# Patient Record
Sex: Female | Born: 1975 | Race: White | Hispanic: No | Smoking: Never smoker
Health system: Southern US, Community
[De-identification: ages and names within clinical notes are randomized; demographics above are authoritative.]

## PROBLEM LIST (undated history)

## (undated) DIAGNOSIS — Z8489 Family history of other specified conditions: Secondary | ICD-10-CM

## (undated) DIAGNOSIS — K219 Gastro-esophageal reflux disease without esophagitis: Secondary | ICD-10-CM

## (undated) DIAGNOSIS — I10 Essential (primary) hypertension: Secondary | ICD-10-CM

## (undated) DIAGNOSIS — Z803 Family history of malignant neoplasm of breast: Secondary | ICD-10-CM

## (undated) DIAGNOSIS — Z9189 Other specified personal risk factors, not elsewhere classified: Secondary | ICD-10-CM

## (undated) DIAGNOSIS — M1990 Unspecified osteoarthritis, unspecified site: Secondary | ICD-10-CM

## (undated) DIAGNOSIS — Z1371 Encounter for nonprocreative screening for genetic disease carrier status: Secondary | ICD-10-CM

## (undated) DIAGNOSIS — J45909 Unspecified asthma, uncomplicated: Secondary | ICD-10-CM

## (undated) HISTORY — PX: BILATERAL CARPAL TUNNEL RELEASE: SHX6508

## (undated) HISTORY — PX: CHOLECYSTECTOMY: SHX55

## (undated) HISTORY — PX: WISDOM TOOTH EXTRACTION: SHX21

## (undated) HISTORY — DX: Encounter for nonprocreative screening for genetic disease carrier status: Z13.71

## (undated) HISTORY — PX: DILATION AND CURETTAGE OF UTERUS: SHX78

## (undated) HISTORY — DX: Other specified personal risk factors, not elsewhere classified: Z91.89

## (undated) HISTORY — DX: Family history of malignant neoplasm of breast: Z80.3

---

## 1998-12-11 ENCOUNTER — Encounter: Payer: Self-pay | Admitting: Neurosurgery

## 1998-12-11 ENCOUNTER — Ambulatory Visit (HOSPITAL_COMMUNITY): Admission: RE | Admit: 1998-12-11 | Discharge: 1998-12-11 | Payer: Self-pay | Admitting: Neurosurgery

## 2006-09-18 ENCOUNTER — Other Ambulatory Visit: Payer: Self-pay

## 2006-09-18 ENCOUNTER — Emergency Department: Payer: Self-pay | Admitting: Emergency Medicine

## 2007-07-23 ENCOUNTER — Emergency Department: Payer: Self-pay | Admitting: Emergency Medicine

## 2007-08-02 ENCOUNTER — Ambulatory Visit: Payer: Self-pay | Admitting: Unknown Physician Specialty

## 2008-03-13 ENCOUNTER — Emergency Department: Payer: Self-pay | Admitting: Emergency Medicine

## 2008-07-21 ENCOUNTER — Ambulatory Visit: Payer: Self-pay

## 2008-09-01 ENCOUNTER — Observation Stay: Payer: Self-pay

## 2008-09-07 ENCOUNTER — Inpatient Hospital Stay: Payer: Self-pay | Admitting: Obstetrics & Gynecology

## 2011-09-07 ENCOUNTER — Ambulatory Visit: Payer: Self-pay | Admitting: Emergency Medicine

## 2011-09-07 DIAGNOSIS — I10 Essential (primary) hypertension: Secondary | ICD-10-CM

## 2011-09-07 LAB — HEPATIC FUNCTION PANEL A (ARMC)
Albumin: 3.6 g/dL (ref 3.4–5.0)
Alkaline Phosphatase: 59 U/L (ref 50–136)
Bilirubin, Direct: <0.1 mg/dL (ref 0.00–0.20)
Bilirubin,Total: 0.2 mg/dL (ref 0.2–1.0)
SGOT(AST): 22 U/L (ref 15–37)
SGPT (ALT): 32 U/L
Total Protein: 7.4 g/dL (ref 6.4–8.2)

## 2011-09-13 ENCOUNTER — Ambulatory Visit: Payer: Self-pay | Admitting: Emergency Medicine

## 2011-09-13 LAB — PREGNANCY, URINE: Pregnancy Test, Urine: NEGATIVE m[IU]/mL

## 2011-09-14 LAB — PATHOLOGY REPORT

## 2011-11-01 ENCOUNTER — Other Ambulatory Visit: Payer: Self-pay | Admitting: Radiology

## 2011-11-01 ENCOUNTER — Ambulatory Visit: Payer: Self-pay | Admitting: Family Medicine

## 2011-11-01 LAB — HCG, QUANTITATIVE, PREGNANCY: Beta Hcg, Quant.: <1 m[IU]/mL — ABNORMAL LOW

## 2011-11-01 LAB — CREATININE, SERUM
Creatinine: 0.76 mg/dL (ref 0.60–1.30)
EGFR (African American): >60
EGFR (Non-African Amer.): >60

## 2011-11-15 ENCOUNTER — Ambulatory Visit: Payer: Self-pay | Admitting: Family Medicine

## 2011-11-29 ENCOUNTER — Ambulatory Visit: Payer: Self-pay | Admitting: Family Medicine

## 2012-06-06 ENCOUNTER — Emergency Department: Payer: Self-pay | Admitting: Emergency Medicine

## 2012-06-06 LAB — COMPREHENSIVE METABOLIC PANEL
Albumin: 3.5 g/dL (ref 3.4–5.0)
Alkaline Phosphatase: 68 U/L (ref 50–136)
Anion Gap: 5 — ABNORMAL LOW (ref 7–16)
BUN: 22 mg/dL — ABNORMAL HIGH (ref 7–18)
Bilirubin,Total: 0.2 mg/dL (ref 0.2–1.0)
Calcium, Total: 8.4 mg/dL — ABNORMAL LOW (ref 8.5–10.1)
Chloride: 103 mmol/L (ref 98–107)
Co2: 29 mmol/L (ref 21–32)
Creatinine: 1.03 mg/dL (ref 0.60–1.30)
EGFR (African American): >60
EGFR (Non-African Amer.): >60
Glucose: 102 mg/dL — ABNORMAL HIGH (ref 65–99)
Osmolality: 277 (ref 275–301)
Potassium: 3.4 mmol/L — ABNORMAL LOW (ref 3.5–5.1)
SGOT(AST): 34 U/L (ref 15–37)
SGPT (ALT): 51 U/L (ref 12–78)
Sodium: 137 mmol/L (ref 136–145)
Total Protein: 7.5 g/dL (ref 6.4–8.2)

## 2012-06-06 LAB — CBC
HCT: 35.6 % (ref 35.0–47.0)
HGB: 12.2 g/dL (ref 12.0–16.0)
MCH: 28.2 pg (ref 26.0–34.0)
MCHC: 34.3 g/dL (ref 32.0–36.0)
MCV: 82 fL (ref 80–100)
Platelet: 263 10*3/uL (ref 150–440)
RBC: 4.34 10*6/uL (ref 3.80–5.20)
RDW: 13.2 % (ref 11.5–14.5)
WBC: 6.3 10*3/uL (ref 3.6–11.0)

## 2012-06-06 LAB — URINALYSIS, COMPLETE
Bacteria: NONE SEEN
Bilirubin,UR: NEGATIVE
Blood: NEGATIVE
Glucose,UR: NEGATIVE mg/dL (ref 0–75)
Ketone: NEGATIVE
Nitrite: NEGATIVE
Ph: 5 (ref 4.5–8.0)
Protein: NEGATIVE
RBC,UR: 6 /HPF (ref 0–5)
Specific Gravity: 1.028 (ref 1.003–1.030)
Squamous Epithelial: 2
WBC UR: 30 /HPF (ref 0–5)

## 2012-06-07 LAB — PREGNANCY, URINE: Pregnancy Test, Urine: NEGATIVE m[IU]/mL

## 2012-06-14 ENCOUNTER — Ambulatory Visit: Payer: Self-pay | Admitting: Family Medicine

## 2013-03-24 ENCOUNTER — Emergency Department: Payer: Self-pay | Admitting: General Practice

## 2013-04-13 ENCOUNTER — Ambulatory Visit: Payer: Self-pay | Admitting: Orthopedic Surgery

## 2013-10-15 ENCOUNTER — Ambulatory Visit: Payer: Self-pay | Admitting: Family Medicine

## 2013-12-04 ENCOUNTER — Encounter: Payer: Self-pay | Admitting: Orthopedic Surgery

## 2013-12-14 ENCOUNTER — Encounter: Payer: Self-pay | Admitting: Orthopedic Surgery

## 2014-01-15 ENCOUNTER — Ambulatory Visit: Payer: Self-pay | Admitting: Anesthesiology

## 2014-01-15 LAB — POTASSIUM: POTASSIUM: 3.9 mmol/L (ref 3.5–5.1)

## 2014-01-21 ENCOUNTER — Ambulatory Visit: Payer: Self-pay | Admitting: Orthopedic Surgery

## 2014-09-06 NOTE — Op Note (Signed)
PATIENT NAME:  Meghan GriceSTINSON, Tenlee D MR#:  [id number] DATE OF BIRTH:  December 28, 1975  DATE OF PROCEDURE:  01/21/2014  PREOPERATIVE DIAGNOSIS: Bilateral carpal tunnel syndrome.   POSTOPERATIVE DIAGNOSIS: Right carpal tunnel syndrome.   PROCEDURE: Bilateral carpal tunnel releases.   ANESTHESIA: General.   SURGEON: Kennedy BuckerMichael Aviyana Sonntag, MD   DESCRIPTION OF PROCEDURE: The patient was brought to the Operating Room and after adequate anesthesia was obtained, both arms were prepped and draped in the usual sterile fashion. Appropriate patient identification and timeout procedure was completed. An Esmarch was used on the right arm with Esmarch to just above the wrist as the tourniquet and volar incision was made in line with the ring metacarpal. The superficial fascia of the hand incised and the transverse carpal ligament identified. There was thenar musculature which came across the anterior volar aspect of the carpal tunnel, consistent with aberrant muscle. This was elevated and the transverse carpal ligament had opened. A vascular hemostat was placed deep to this. Release was carried out proximally and distally until fat was noted around the nerve consistent with no compression, then going more proximally, proximal to the wrist flexion crease. There was an area of constriction of the nerve in the mid carpal tunnel, and after release this appeared to be relieved. The wound was irrigated and then closed after infiltration of 10 mL of 0.5% Sensorcaine without epinephrine. The wound was closed with simple interrupted 5-0 nylon. Xeroform, 4 x 4, Webril and Ace wrap applied and the Esmarch tourniquet removed. Total tourniquet time on that side was approximately 16 minutes.  Going to the left side, an identical procedure carried out with just upper arm tourniquet with the right arm having had an IV above the elbow, so no tourniquet on that side. The tourniquet was raised to 250 mmHg and same incision made. The subcutaneous tissue  spread. The transcarpal ligament incised and release was carried out proximally and distally in a similar fashion. There was less compression visible on the median nerve on the left after release at the level of the wrist flexion crease, there was good vascular blush to the nerve. Adequate decompression had been carried out and the wound was closed in a similar fashion using 10 mL of 0.5% Sensorcaine for  analgesia. Simple interrupted 5-0 nylon skin suture. Xeroform, 4 x 4, Webril and Ace wrap applied. The patient was sent to the recovery room in stable condition.   ESTIMATED BLOOD LOSS: Minimal.   TOURNIQUET TIME: On the left was 12 minutes at 250 mmHg.   ____________________________ Leitha SchullerMichael J. Jessicamarie Amiri, MD mjm:TT D: 01/21/2014 17:02:11 ET T: 01/21/2014 22:40:28 ET JOB#: [id number]  cc: Leitha SchullerMichael J. Starleen Trussell, MD, <Dictator> Leitha SchullerMICHAEL J Arnell Slivinski MD ELECTRONICALLY SIGNED 01/23/2014 8:17

## 2014-09-07 NOTE — Op Note (Signed)
PATIENT NAME:  Meghan Thompson, Meghan Thompson MR#:  [id number] DATE OF BIRTH:  01-04-1976  DATE OF PROCEDURE:  09/13/2011  PREOPERATIVE DIAGNOSIS: Acute cholecystitis.   POSTOPERATIVE DIAGNOSIS: Acute cholecystitis.   PROCEDURE PERFORMED: Laparoscopic cholecystectomy with cholangiogram.   SURGEON: Jovita GammaMasud Dacoda Spallone, MD  DESCRIPTION OF PROCEDURE: The patient was then brought to surgery. Under general anesthesia, the abdomen was then prepped and draped. First of all, a small incision was made above the umbilicus. After cutting skin and subcutaneous tissue, the fascia was cut until we reached the peritoneum. After entering into the abdomen, I put a trocar in and air was insufflated. After that another trocar was then put in the epigastric region, two 5 mm put in the right upper quadrant of the abdomen. The gallbladder had a lot of adhesions. Those adhesions were then released one by one to make the gallbladder clean of all of the adhesions. After that the gallbladder was then lifted up with a clamp and another clamp was applied at the Hartmann's pouch area. The patient had a lot of stones in the gallbladder. It was filled with stones so hard to put a clamp there. Finally I pushed all the stones up and did a cholangiogram. Cholangiogram showed normal cystic duct, showed the common bile duct was normal, and the dye was going in the duodenum normally. After this was done, the cholangiocatheter was then removed. Dissection was done. The cystic artery was then clipped and cut. The cystic duct was then isolated, clipped and cut, and the gallbladder was then removed from the liver bed. There was some oozing on the liver bed because of multiple vessels going from the gallbladder into the liver. All the gallbladder vessels were fulgurated. The gallbladder was removed completely. After we did this irrigation of the gallbladder was done to make sure there was no biliary leak and no bleeding. The gallbladder was then removed from the  umbilical port. I had to make the incision bigger in the umbilicus to get the gallbladder out because the patient had lots of stones. After this was done, all the trocars were removed under direct vision. The umbilical port was        then closed with interrupted 0 Vicryl sutures. Marcaine was injected and staples were applied. The patient tolerated the procedure well and was sent to the recovery room in satisfactory condition.  ____________________________ Alton RevereMasud S. Cecelia ByarsHashmi, MD msh:slb Thompson: 09/13/2011 10:53:33 ET T: 09/13/2011 11:29:00 ET JOB#: [id number]  cc: Stevie Charter S. Cecelia ByarsHashmi, MD, <Dictator> Meryle ReadyMASUD S Evin Loiseau MD ELECTRONICALLY SIGNED 09/13/2011 15:23

## 2014-11-10 ENCOUNTER — Other Ambulatory Visit: Payer: Self-pay | Admitting: Family Medicine

## 2014-11-10 ENCOUNTER — Ambulatory Visit
Admission: RE | Admit: 2014-11-10 | Discharge: 2014-11-10 | Disposition: A | Discharge status: Home / Self Care | Payer: BLUE CROSS/BLUE SHIELD | Source: Ambulatory Visit | Attending: Family Medicine | Admitting: Family Medicine

## 2014-11-10 DIAGNOSIS — S86801A Unspecified injury of other muscle(s) and tendon(s) at lower leg level, right leg, initial encounter: Secondary | ICD-10-CM | POA: Insufficient documentation

## 2014-11-10 DIAGNOSIS — S8001XA Contusion of right knee, initial encounter: Secondary | ICD-10-CM | POA: Insufficient documentation

## 2014-11-10 DIAGNOSIS — M25561 Pain in right knee: Secondary | ICD-10-CM | POA: Diagnosis present

## 2014-11-10 DIAGNOSIS — M25861 Other specified joint disorders, right knee: Secondary | ICD-10-CM | POA: Diagnosis not present

## 2014-11-10 DIAGNOSIS — M948X6 Other specified disorders of cartilage, lower leg: Secondary | ICD-10-CM | POA: Insufficient documentation

## 2014-11-10 DIAGNOSIS — S8991XA Unspecified injury of right lower leg, initial encounter: Secondary | ICD-10-CM | POA: Insufficient documentation

## 2015-01-12 ENCOUNTER — Ambulatory Visit
Admission: RE | Admit: 2015-01-12 | Discharge: 2015-01-12 | Disposition: A | Discharge status: Home / Self Care | Payer: BLUE CROSS/BLUE SHIELD | Source: Ambulatory Visit | Attending: Family Medicine | Admitting: Family Medicine

## 2015-01-12 ENCOUNTER — Other Ambulatory Visit: Payer: Self-pay | Admitting: Family Medicine

## 2015-01-12 DIAGNOSIS — Z1231 Encounter for screening mammogram for malignant neoplasm of breast: Secondary | ICD-10-CM

## 2015-01-29 ENCOUNTER — Other Ambulatory Visit: Payer: BLUE CROSS/BLUE SHIELD

## 2015-01-29 ENCOUNTER — Encounter: Payer: Self-pay | Admitting: *Deleted

## 2015-01-29 NOTE — Patient Instructions (Signed)
  Your procedure is scheduled on: 02-03-15 Report to [facility] SAME [facility]GERY 2ND FLOOR To find out your arrival time please call [phone] between 1PM - 3PM on 02-02-15 [facility])  Remember: Instructions that are not followed completely may result in serious medical risk, up to and including death, or upon the discretion of your surgeon and anesthesiologist your surgery may need to be rescheduled.    _X___ 1. Do not eat food or drink liquids after midnight. No gum chewing or hard candies.     _X___ 2. No Alcohol for 24 hours before or after surgery.   ____ 3. Bring all medications with you on the day of surgery if instructed.    _X___ 4. Notify your doctor if there is any change in your medical condition     (cold, fever, infections).     Do not wear jewelry, make-up, hairpins, clips or nail polish.  Do not wear lotions, powders, or perfumes. You may wear deodorant.  Do not shave 48 hours prior to surgery. Men may shave face and neck.  Do not bring valuables to the hospital.    [facility] is not responsible for any belongings or valuables.               Contacts, dentures or bridgework may not be worn into surgery.  Leave your suitcase in the car. After surgery it may be brought to your room.  For patients admitted to the hospital, discharge time is determined by your treatment team.   Patients discharged the day of surgery will not be allowed to drive home.   Please read over the following fact sheets that you were given:    _X___ Take these medicines the morning of surgery with A SIP OF WATER:    1. OMEPRAZOLE  2. TAKE AN OMEPRAZOLE ON Monday NIGHT  3.   4.  5.  6.  ____ Fleet Enema (as directed)   _X___ Use CHG Soap as directed  _X___ Use inhalers on the day of surgery-USE VENTOLIN AT HOME AND BRING TO THE HOSPITAL  ____ Stop metformin 2 days prior to surgery    ____ Take 1/2 of usual insulin dose the night before surgery and none on the morning of surgery.    ____ Stop Coumadin/Plavix/aspirin-N/A  _X___ Stop Anti-inflammatories-STOP IBUPROFEN NOW-NO NSAIDS OR ASPIRIN PRODUCTS-TYLENOL OK   ____ Stop supplements until after surgery.    ____ Bring C-Pap to the hospital.

## 2015-02-02 ENCOUNTER — Encounter
Admission: RE | Admit: 2015-02-02 | Discharge: 2015-02-02 | Disposition: A | Discharge status: Home / Self Care | Payer: BLUE CROSS/BLUE SHIELD | Source: Ambulatory Visit | Attending: Anesthesiology | Admitting: Anesthesiology

## 2015-02-02 DIAGNOSIS — Z79899 Other long term (current) drug therapy: Secondary | ICD-10-CM | POA: Diagnosis not present

## 2015-02-02 DIAGNOSIS — E669 Obesity, unspecified: Secondary | ICD-10-CM | POA: Diagnosis not present

## 2015-02-02 DIAGNOSIS — M2241 Chondromalacia patellae, right knee: Secondary | ICD-10-CM | POA: Diagnosis not present

## 2015-02-02 DIAGNOSIS — K219 Gastro-esophageal reflux disease without esophagitis: Secondary | ICD-10-CM | POA: Diagnosis not present

## 2015-02-02 DIAGNOSIS — Z8249 Family history of ischemic heart disease and other diseases of the circulatory system: Secondary | ICD-10-CM | POA: Diagnosis not present

## 2015-02-02 DIAGNOSIS — Z803 Family history of malignant neoplasm of breast: Secondary | ICD-10-CM | POA: Diagnosis not present

## 2015-02-02 DIAGNOSIS — Z7951 Long term (current) use of inhaled steroids: Secondary | ICD-10-CM | POA: Diagnosis not present

## 2015-02-02 DIAGNOSIS — I10 Essential (primary) hypertension: Secondary | ICD-10-CM | POA: Diagnosis not present

## 2015-02-02 DIAGNOSIS — Z823 Family history of stroke: Secondary | ICD-10-CM | POA: Diagnosis not present

## 2015-02-02 DIAGNOSIS — M23251 Derangement of posterior horn of lateral meniscus due to old tear or injury, right knee: Secondary | ICD-10-CM | POA: Diagnosis not present

## 2015-02-02 DIAGNOSIS — Z881 Allergy status to other antibiotic agents status: Secondary | ICD-10-CM | POA: Diagnosis not present

## 2015-02-02 DIAGNOSIS — J45909 Unspecified asthma, uncomplicated: Secondary | ICD-10-CM | POA: Diagnosis not present

## 2015-02-02 DIAGNOSIS — Z6836 Body mass index (BMI) 36.0-36.9, adult: Secondary | ICD-10-CM | POA: Diagnosis not present

## 2015-02-02 DIAGNOSIS — M179 Osteoarthritis of knee, unspecified: Secondary | ICD-10-CM | POA: Diagnosis not present

## 2015-02-02 DIAGNOSIS — Z8042 Family history of malignant neoplasm of prostate: Secondary | ICD-10-CM | POA: Diagnosis not present

## 2015-02-02 DIAGNOSIS — S83014A Lateral dislocation of right patella, initial encounter: Secondary | ICD-10-CM | POA: Diagnosis not present

## 2015-02-02 DIAGNOSIS — Z9049 Acquired absence of other specified parts of digestive tract: Secondary | ICD-10-CM | POA: Diagnosis not present

## 2015-02-02 DIAGNOSIS — Z8262 Family history of osteoporosis: Secondary | ICD-10-CM | POA: Diagnosis not present

## 2015-02-02 DIAGNOSIS — S83006A Unspecified dislocation of unspecified patella, initial encounter: Secondary | ICD-10-CM | POA: Diagnosis present

## 2015-02-02 DIAGNOSIS — Z833 Family history of diabetes mellitus: Secondary | ICD-10-CM | POA: Diagnosis not present

## 2015-02-02 DIAGNOSIS — Z8261 Family history of arthritis: Secondary | ICD-10-CM | POA: Diagnosis not present

## 2015-02-02 LAB — POTASSIUM: Potassium: 4.3 mmol/L (ref 3.5–5.1)

## 2015-02-02 NOTE — OR Nursing (Signed)
Dr. Michelle Nasuti to proceed after reviewing EKG on 02/02/15

## 2015-02-03 ENCOUNTER — Ambulatory Visit
Admission: RE | Admit: 2015-02-03 | Discharge: 2015-02-03 | Disposition: A | Discharge status: Home / Self Care | Payer: BLUE CROSS/BLUE SHIELD | Source: Ambulatory Visit | Attending: Orthopedic Surgery | Admitting: Orthopedic Surgery

## 2015-02-03 ENCOUNTER — Ambulatory Visit: Payer: BLUE CROSS/BLUE SHIELD | Admitting: Anesthesiology

## 2015-02-03 ENCOUNTER — Ambulatory Visit: Payer: BLUE CROSS/BLUE SHIELD

## 2015-02-03 ENCOUNTER — Encounter
Admission: RE | Disposition: A | Discharge status: Home / Self Care | Payer: Self-pay | Source: Ambulatory Visit | Attending: Orthopedic Surgery

## 2015-02-03 DIAGNOSIS — Z8262 Family history of osteoporosis: Secondary | ICD-10-CM | POA: Insufficient documentation

## 2015-02-03 DIAGNOSIS — Z803 Family history of malignant neoplasm of breast: Secondary | ICD-10-CM | POA: Insufficient documentation

## 2015-02-03 DIAGNOSIS — Z6836 Body mass index (BMI) 36.0-36.9, adult: Secondary | ICD-10-CM | POA: Insufficient documentation

## 2015-02-03 DIAGNOSIS — Z881 Allergy status to other antibiotic agents status: Secondary | ICD-10-CM | POA: Insufficient documentation

## 2015-02-03 DIAGNOSIS — M2241 Chondromalacia patellae, right knee: Secondary | ICD-10-CM | POA: Insufficient documentation

## 2015-02-03 DIAGNOSIS — Z833 Family history of diabetes mellitus: Secondary | ICD-10-CM | POA: Insufficient documentation

## 2015-02-03 DIAGNOSIS — S83014A Lateral dislocation of right patella, initial encounter: Secondary | ICD-10-CM | POA: Diagnosis not present

## 2015-02-03 DIAGNOSIS — K219 Gastro-esophageal reflux disease without esophagitis: Secondary | ICD-10-CM | POA: Insufficient documentation

## 2015-02-03 DIAGNOSIS — Z79899 Other long term (current) drug therapy: Secondary | ICD-10-CM | POA: Insufficient documentation

## 2015-02-03 DIAGNOSIS — Z8249 Family history of ischemic heart disease and other diseases of the circulatory system: Secondary | ICD-10-CM | POA: Insufficient documentation

## 2015-02-03 DIAGNOSIS — Z419 Encounter for procedure for purposes other than remedying health state, unspecified: Secondary | ICD-10-CM

## 2015-02-03 DIAGNOSIS — Z8261 Family history of arthritis: Secondary | ICD-10-CM | POA: Insufficient documentation

## 2015-02-03 DIAGNOSIS — M179 Osteoarthritis of knee, unspecified: Secondary | ICD-10-CM | POA: Insufficient documentation

## 2015-02-03 DIAGNOSIS — Z8042 Family history of malignant neoplasm of prostate: Secondary | ICD-10-CM | POA: Insufficient documentation

## 2015-02-03 DIAGNOSIS — Z7951 Long term (current) use of inhaled steroids: Secondary | ICD-10-CM | POA: Insufficient documentation

## 2015-02-03 DIAGNOSIS — J45909 Unspecified asthma, uncomplicated: Secondary | ICD-10-CM | POA: Insufficient documentation

## 2015-02-03 DIAGNOSIS — Z9049 Acquired absence of other specified parts of digestive tract: Secondary | ICD-10-CM | POA: Insufficient documentation

## 2015-02-03 DIAGNOSIS — I10 Essential (primary) hypertension: Secondary | ICD-10-CM | POA: Insufficient documentation

## 2015-02-03 DIAGNOSIS — Z823 Family history of stroke: Secondary | ICD-10-CM | POA: Insufficient documentation

## 2015-02-03 DIAGNOSIS — E669 Obesity, unspecified: Secondary | ICD-10-CM | POA: Insufficient documentation

## 2015-02-03 DIAGNOSIS — M23251 Derangement of posterior horn of lateral meniscus due to old tear or injury, right knee: Secondary | ICD-10-CM | POA: Insufficient documentation

## 2015-02-03 HISTORY — DX: Family history of other specified conditions: Z84.89

## 2015-02-03 HISTORY — PX: KNEE ARTHROSCOPY WITH MEDIAL PATELLAR FEMORAL LIGAMENT RECONSTRUCTION: SHX5652

## 2015-02-03 HISTORY — DX: Unspecified asthma, uncomplicated: J45.909

## 2015-02-03 HISTORY — DX: Essential (primary) hypertension: I10

## 2015-02-03 HISTORY — DX: Gastro-esophageal reflux disease without esophagitis: K21.9

## 2015-02-03 SURGERY — REPAIR, TENDON, PATELLAR, ARTHROSCOPIC
Anesthesia: General | Site: Knee | Laterality: Right | Wound class: Clean

## 2015-02-03 MED ORDER — SODIUM CHLORIDE 0.9 % IV SOLN: INTRAVENOUS | Status: DC

## 2015-02-03 MED ORDER — DEXAMETHASONE SODIUM PHOSPHATE 4 MG/ML IJ SOLN
INTRAMUSCULAR | Status: DC | PRN
  Administered 2015-02-03: 5 mg via INTRAVENOUS

## 2015-02-03 MED ORDER — NEOMYCIN-POLYMYXIN B GU 40-200000 IR SOLN
Status: AC
  Filled 2015-02-03: qty 2

## 2015-02-03 MED ORDER — LIDOCAINE HCL (CARDIAC) 20 MG/ML IV SOLN
INTRAVENOUS | Status: DC | PRN
  Administered 2015-02-03: 50 mg via INTRAVENOUS

## 2015-02-03 MED ORDER — ONDANSETRON HCL 4 MG PO TABS: 4.0000 mg | ORAL_TABLET | Freq: Four times a day (QID) | ORAL | Status: DC | PRN

## 2015-02-03 MED ORDER — LACTATED RINGERS IV SOLN
INTRAVENOUS | Status: DC
  Administered 2015-02-03 (×2): via INTRAVENOUS

## 2015-02-03 MED ORDER — METOCLOPRAMIDE HCL 10 MG PO TABS: 5.0000 mg | ORAL_TABLET | Freq: Three times a day (TID) | ORAL | Status: DC | PRN

## 2015-02-03 MED ORDER — ONDANSETRON HCL 4 MG/2ML IJ SOLN
4.0000 mg | Freq: Once | INTRAMUSCULAR | Status: AC | PRN
  Administered 2015-02-03: 4 mg via INTRAVENOUS

## 2015-02-03 MED ORDER — FENTANYL CITRATE (PF) 100 MCG/2ML IJ SOLN
INTRAMUSCULAR | Status: AC
  Filled 2015-02-03: qty 2

## 2015-02-03 MED ORDER — OXYCODONE-ACETAMINOPHEN 5-325 MG PO TABS: 1.0000 | ORAL_TABLET | ORAL | Status: DC | PRN

## 2015-02-03 MED ORDER — BUPIVACAINE-EPINEPHRINE 0.5% -1:200000 IJ SOLN
INTRAMUSCULAR | Status: DC | PRN
Start: 2015-02-03 — End: 2015-02-03
  Administered 2015-02-03: 30 mL

## 2015-02-03 MED ORDER — METOCLOPRAMIDE HCL 5 MG/ML IJ SOLN: 5.0000 mg | Freq: Three times a day (TID) | INTRAMUSCULAR | Status: DC | PRN

## 2015-02-03 MED ORDER — BUPIVACAINE-EPINEPHRINE (PF) 0.25% -1:200000 IJ SOLN
INTRAMUSCULAR | Status: AC
  Filled 2015-02-03: qty 30

## 2015-02-03 MED ORDER — OXYCODONE-ACETAMINOPHEN 5-325 MG PO TABS
ORAL_TABLET | ORAL | Status: DC
Start: 2015-02-03 — End: 2015-02-03
  Filled 2015-02-03: qty 1

## 2015-02-03 MED ORDER — MIDAZOLAM HCL 2 MG/2ML IJ SOLN
INTRAMUSCULAR | Status: DC | PRN
  Administered 2015-02-03: 2 mg via INTRAVENOUS

## 2015-02-03 MED ORDER — NEOMYCIN-POLYMYXIN B GU 40-200000 IR SOLN
Status: DC | PRN
  Administered 2015-02-03: 2 mL

## 2015-02-03 MED ORDER — ONDANSETRON HCL 4 MG/2ML IJ SOLN: 4.0000 mg | Freq: Four times a day (QID) | INTRAMUSCULAR | Status: DC | PRN

## 2015-02-03 MED ORDER — CEFAZOLIN SODIUM-DEXTROSE 2-3 GM-% IV SOLR
INTRAVENOUS | Status: AC
  Filled 2015-02-03: qty 50

## 2015-02-03 MED ORDER — GLYCOPYRROLATE 0.2 MG/ML IJ SOLN
INTRAMUSCULAR | Status: DC | PRN
  Administered 2015-02-03: 0.2 mg via INTRAVENOUS

## 2015-02-03 MED ORDER — OXYCODONE-ACETAMINOPHEN 5-325 MG PO TABS
1.0000 | ORAL_TABLET | ORAL | Status: DC | PRN
  Administered 2015-02-03: 1 via ORAL

## 2015-02-03 MED ORDER — FENTANYL CITRATE (PF) 100 MCG/2ML IJ SOLN
25.0000 ug | INTRAMUSCULAR | Status: AC | PRN
  Administered 2015-02-03 (×6): 25 ug via INTRAVENOUS

## 2015-02-03 MED ORDER — OXYCODONE-ACETAMINOPHEN 5-325 MG PO TABS: 1.0000 | ORAL_TABLET | Freq: Four times a day (QID) | ORAL | Status: AC | PRN

## 2015-02-03 MED ORDER — ONDANSETRON HCL 4 MG/2ML IJ SOLN
INTRAMUSCULAR | Status: DC | PRN
  Administered 2015-02-03: 4 mg via INTRAVENOUS

## 2015-02-03 MED ORDER — FENTANYL CITRATE (PF) 100 MCG/2ML IJ SOLN
INTRAMUSCULAR | Status: DC | PRN
  Administered 2015-02-03 (×5): 50 ug via INTRAVENOUS

## 2015-02-03 MED ORDER — ONDANSETRON HCL 4 MG/2ML IJ SOLN
INTRAMUSCULAR | Status: AC
  Filled 2015-02-03: qty 2

## 2015-02-03 MED ORDER — CEFAZOLIN SODIUM-DEXTROSE 2-3 GM-% IV SOLR
2.0000 g | Freq: Once | INTRAVENOUS | Status: AC
  Administered 2015-02-03: 2 g via INTRAVENOUS

## 2015-02-03 MED ORDER — PROPOFOL 10 MG/ML IV BOLUS
INTRAVENOUS | Status: DC | PRN
  Administered 2015-02-03: 150 mg via INTRAVENOUS

## 2015-02-03 MED ORDER — BUPIVACAINE-EPINEPHRINE (PF) 0.5% -1:200000 IJ SOLN
INTRAMUSCULAR | Status: AC
  Filled 2015-02-03: qty 30

## 2015-02-03 SURGICAL SUPPLY — 34 items
BANDAGE ELASTIC 4 CLIP ST LF (GAUZE/BANDAGES/DRESSINGS) ×3 IMPLANT
BLADE INCISOR PLUS 4.5 (BLADE) ×3 IMPLANT
BLADE SHAVER 4.5 DBL SERAT CV (CUTTER) ×3 IMPLANT
BRACE KNEE POST OP SHORT (BRACE) ×2 IMPLANT
CHLORAPREP W/TINT 26ML (MISCELLANEOUS) ×3 IMPLANT
DRAPE C-ARM XRAY 36X54 (DRAPES) ×3 IMPLANT
DRAPE C-ARMOR (DRAPES) ×3 IMPLANT
ELECT CAUTERY BLADE 6.4 (BLADE) ×3 IMPLANT
GAUZE PETRO XEROFOAM 1X8 (MISCELLANEOUS) ×3 IMPLANT
GAUZE SPONGE 4X4 12PLY STRL (GAUZE/BANDAGES/DRESSINGS) ×3 IMPLANT
GLOVE SURG ORTHO 9.0 STRL STRW (GLOVE) ×3 IMPLANT
GOWN SPECIALTY ULTRA XL (MISCELLANEOUS) ×3 IMPLANT
GRADUATE 1200CC STRL 31836 (MISCELLANEOUS) ×3 IMPLANT
GRAFT TISS 230-320 GRACILIS (Bone Implant) IMPLANT
IV LACTATED RINGER IRRG 3000ML (IV SOLUTION) ×12
IV LR IRRIG 3000ML ARTHROMATIC (IV SOLUTION) ×4 IMPLANT
MANIFOLD NEPTUNE II (INSTRUMENTS) ×3 IMPLANT
NDL FILTER BLUNT 18X1 1/2 (NEEDLE) ×1 IMPLANT
NEEDLE FILTER BLUNT 18X 1/2SAF (NEEDLE) ×2
NEEDLE FILTER BLUNT 18X1 1/2 (NEEDLE) ×1 IMPLANT
PACK ARTHROSCOPY KNEE (MISCELLANEOUS) ×3 IMPLANT
PACK IMPLANT BIOCOMPOSITE MPFL (Orthopedic Implant) ×2 IMPLANT
PENCIL ELECTRO HAND CTR (MISCELLANEOUS) ×3 IMPLANT
SET TUBE SUCT SHAVER OUTFL 24K (TUBING) ×3 IMPLANT
SET TUBE TIP INTRA-ARTICULAR (MISCELLANEOUS) ×3 IMPLANT
SUCTION FRAZIER TIP 10 FR DISP (SUCTIONS) ×3 IMPLANT
SUT ETHILON 4-0 (SUTURE) ×3
SUT ETHILON 4-0 FS2 18XMFL BLK (SUTURE) ×1
SUTURE ETHLN 4-0 FS2 18XMF BLK (SUTURE) ×1 IMPLANT
SYR 5ML LL (SYRINGE) ×3 IMPLANT
TENDON GRACILIS FROZEN (Bone Implant) ×3 IMPLANT
TENDON GRACILIS FROZEN 230-320 (Bone Implant) ×1 IMPLANT
TUBING ARTHRO INFLOW-ONLY STRL (TUBING) ×3 IMPLANT
WAND HAND CNTRL MULTIVAC 50 (MISCELLANEOUS) ×3 IMPLANT

## 2015-02-03 NOTE — Anesthesia Preprocedure Evaluation (Signed)
Anesthesia Evaluation  Patient identified by MRN, date of birth, ID band Patient awake    Airway Mallampati: III       Dental no notable dental hx.    Pulmonary asthma ,     + decreased breath sounds      Cardiovascular hypertension, Pt. on medications Normal cardiovascular exam     Neuro/Psych    GI/Hepatic Neg liver ROS, GERD  Medicated,  Endo/Other  negative endocrine ROS  Renal/GU negative Renal ROS     Musculoskeletal   Abdominal (+) + obese,   Peds  Hematology negative hematology ROS (+)   Anesthesia Other Findings   Reproductive/Obstetrics                             Anesthesia Physical Anesthesia Plan  ASA: III  Anesthesia Plan: General   Post-op Pain Management:    Induction: Intravenous  Airway Management Planned: LMA  Additional Equipment:   Intra-op Plan:   Post-operative Plan:   Informed Consent: I have reviewed the patients History and Physical, chart, labs and discussed the procedure including the risks, benefits and alternatives for the proposed anesthesia with the patient or authorized representative who has indicated his/her understanding and acceptance.     Plan Discussed with: CRNA  Anesthesia Plan Comments:         Anesthesia Quick Evaluation

## 2015-02-03 NOTE — OR Nursing (Signed)
Nauseated/ Zofran  IVP given.  Cool cloth to forehead.  Safety assessment and consent for aromatherapy received.  Peppermint oil on cotton ball for inhalation in room to aid with nausea.

## 2015-02-03 NOTE — Anesthesia Postprocedure Evaluation (Signed)
  Anesthesia Post-op Note  Patient: [name]  Procedure(s) Performed: Procedure(s): KNEE ARTHROSCOPY WITH PARTIAL LASTERAL MENISECTOMY, MICROSCOPIC LATERAL RELEASE, MPFL RECONSTRUCTION (Right)  Anesthesia type:General  Patient location: PACU  Post pain: Pain level controlled  Post assessment: Post-op Vital signs reviewed, Patient's Cardiovascular Status Stable, Respiratory Function Stable, Patent Airway and No signs of Nausea or vomiting  Post vital signs: Reviewed and stable  Last Vitals:  Filed Vitals:   02/03/15 1235  BP:   Pulse: 74  Temp:   Resp: 16    Level of consciousness: awake, alert  and patient cooperative  Complications: No apparent anesthesia complications

## 2015-02-03 NOTE — Op Note (Signed)
02/03/2015  12:16 PM  PATIENT:  Meghan Thompson  39 y.o. female  PRE-OPERATIVE DIAGNOSIS:  lateral dislocation of patella  POST-OPERATIVE DIAGNOSIS:  lateral dislocation of patella, lateral meniscus tear, arthritis  PROCEDURE:  Procedure(s): KNEE ARTHROSCOPY WITH PARTIAL LASTERAL MENISECTOMY, MICROSCOPIC LATERAL RELEASE, MPFL RECONSTRUCTION (Right)  SURGEON: Leitha Schuller, MD  ASSISTANTS: None  ANESTHESIA:   general  EBL:  Total I/O In: 800 [I.V.:800] Out: -   BLOOD ADMINISTERED:none  DRAINS: none   LOCAL MEDICATIONS USED:  MARCAINE     SPECIMEN:  No Specimen  DISPOSITION OF SPECIMEN:  N/A  COUNTS:  YES  TOURNIQUET:  83 minutes at 300 mmHg  IMPLANTS: Arthrex interference screw and suture anchors  DICTATION: .Dragon Dictation patient brought the operating room and after adequate general anesthesia was obtained the right leg was prepped and draped in sterile fashion. After patient identification and timeout procedures were completed, tourniquet was raised to 300 mmHg. Inferolateral portal was made and the arthroscope was introduced initial inspection revealing nearly dislocated patella. 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 Going to the anterior cruciate ligament PCL was intact. Lateral compartment revealed posterior horn tear of the lateral meniscus involving the middle and posterior thirds approximately the inner third with a flap tear present. This is debrided with use of a meniscal punch and ArthriCare wand. There is also significant grade 3 changes to the tibial condyle with fissuring down to the bone. At this point a lateral release was carried out after having address the meniscus with the patella not being able to be reduced to the midline which she could not be done  previously. Next the arthroscope and argentation withdrawn and the open NPFL reconstruction carried out first preparing the graft reversals tendon sutures of both hands. An incision was made medial to patella the patella and with C-arm to guide were inserted in the proximal half of the patella drilling and then setting the suture anchor with tendon into these tunnels care was taken then to determine the attachment point for the graft onto the femoral side using the Arthrex guide a small incision was made drilling guide were inserted in the appropriate position and drilled out medial and's and slightly anterior and slightly proximal this was overdrilled fracture 35 mm and the graft was then passed through the tunnel into the tunnel rather and held tight as the patella was held in a man in the midline position the interference screw was then passed over the nitinol wire and there is a good compressive fit with the graft appearing to have appropriate tension fluoroscopic views were obtained of the mid patella appeared to be in the midline and these were saved. All the introducer removed at this time and the arthroscope was reintroduced patella was now in the midline with appropriate location. The knee was irrigated until clear argentation again withdrawn and the wounds are closed with 2-0 Vicryl substantially and skin staples 30 cc of half percent Sensorcaine with epinephrine was infiltrated in the para-articular tissue the wound was dressed with Xeroform 4 x 4's web roll ABDs and Ace wrap followed by a hinged knee brace locked from 20-60 patient sent to recovery in stable condition  PLAN OF CARE: Discharge to home after PACU  PATIENT DISPOSITION:  PACU - hemodynamically stable.

## 2015-02-03 NOTE — Anesthesia Procedure Notes (Signed)
Procedure Name: LMA Insertion Performed by: LOGAN, BENJAMIN Pre-anesthesia Checklist: Patient identified, Patient being monitored, Timeout performed, Emergency Drugs available and Suction available Patient Re-evaluated:Patient Re-evaluated prior to inductionOxygen Delivery Method: Circle system utilized Preoxygenation: Pre-oxygenation with 100% oxygen Intubation Type: IV induction LMA: LMA inserted LMA Size: 3.0 Tube type: Oral Number of attempts: 1 Placement Confirmation: positive ETCO2 and breath sounds checked- equal and bilateral Tube secured with: Tape Dental Injury: Teeth and Oropharynx as per pre-operative assessment        

## 2015-02-03 NOTE — OR Nursing (Signed)
Meghan Thompson. No  Further vomiting

## 2015-02-03 NOTE — OR Nursing (Signed)
Patient refused pregnancy test this am stating "I am in a same sex relationship and I just finished my period."  Dr. Mordecai Rasmussen on unit and informed order received not to obtain urine pregnancy test this am.

## 2015-02-03 NOTE — H&P (Signed)
Reviewed paper H+P, will be scanned into chart. No changes noted.  

## 2015-02-03 NOTE — Discharge Instructions (Addendum)
Keep brace on until recheck. Partial weightbearing to the right leg. Aspirin 325 mg daily until walking normally  AMBULATORY SURGERY  DISCHARGE INSTRUCTIONS   1) The drugs that you were given will stay in your system until tomorrow so for the next 24 hours you should not:  A) Drive an automobile B) Make any legal decisions C) Drink any alcoholic beverage   2) You may resume regular meals tomorrow.  Today it is better to start with liquids and gradually work up to solid foods.  You may eat anything you prefer, but it is better to start with liquids, then soup and crackers, and gradually work up to solid foods.   3) Please notify your doctor immediately if you have any unusual bleeding, trouble breathing, redness and pain at the surgery site, drainage, fever, or pain not relieved by medication.    4) Additional Instructions: Sponge bath until follow up appointment.  Please contact your physician with any problems or Same Day Surgery at [phone], Monday through Friday 6 am to 4 pm, or [place] at [facility] number at [phone].

## 2015-02-03 NOTE — OR Nursing (Signed)
Vomitted 200cc of clear yelllow liquid

## 2015-02-03 NOTE — Transfer of Care (Signed)
Immediate Anesthesia Transfer of Care Note  Patient: Meghan Thompson  Procedure(s) Performed: Procedure(s): KNEE ARTHROSCOPY WITH PARTIAL LASTERAL MENISECTOMY, MICROSCOPIC LATERAL RELEASE, MPFL RECONSTRUCTION (Right)  Patient Location: PACU  Anesthesia Type:General  Level of Consciousness: awake  Airway & Oxygen Therapy: Patient Spontanous Breathing and Patient connected to face mask oxygen  Post-op Assessment: Report given to RN  Post vital signs: Reviewed  Last Vitals:  Filed Vitals:   02/03/15 1217  BP: 155/81  Pulse: 82  Temp: 38.1 C  Resp: 14    Complications: No apparent anesthesia complications

## 2015-05-17 DIAGNOSIS — Z9189 Other specified personal risk factors, not elsewhere classified: Secondary | ICD-10-CM

## 2015-05-17 DIAGNOSIS — Z1371 Encounter for nonprocreative screening for genetic disease carrier status: Secondary | ICD-10-CM

## 2015-05-17 DIAGNOSIS — Z803 Family history of malignant neoplasm of breast: Secondary | ICD-10-CM

## 2015-05-17 HISTORY — DX: Family history of malignant neoplasm of breast: Z80.3

## 2015-05-17 HISTORY — DX: Encounter for nonprocreative screening for genetic disease carrier status: Z13.71

## 2015-05-17 HISTORY — DX: Other specified personal risk factors, not elsewhere classified: Z91.89

## 2015-10-16 ENCOUNTER — Encounter: Payer: Self-pay | Admitting: Orthopedic Surgery

## 2015-10-23 ENCOUNTER — Other Ambulatory Visit: Payer: Self-pay | Admitting: Family Medicine

## 2015-10-23 DIAGNOSIS — Z1231 Encounter for screening mammogram for malignant neoplasm of breast: Secondary | ICD-10-CM

## 2016-01-13 ENCOUNTER — Ambulatory Visit
Admission: RE | Admit: 2016-01-13 | Discharge: 2016-01-13 | Disposition: A | Discharge status: Home / Self Care | Payer: BLUE CROSS/BLUE SHIELD | Source: Ambulatory Visit | Attending: Family Medicine | Admitting: Family Medicine

## 2016-01-13 ENCOUNTER — Other Ambulatory Visit: Payer: Self-pay | Admitting: Family Medicine

## 2016-01-13 DIAGNOSIS — Z1231 Encounter for screening mammogram for malignant neoplasm of breast: Secondary | ICD-10-CM

## 2016-08-30 IMAGING — CR DG C-ARM 61-120 MIN
2 series · 2 of 2 positions shown · non-contrast
Comparison: MRI the right knee 11/10/2014

CLINICAL DATA: Perioperative.

EXAM:
RIGHT KNEE - 1-2 VIEW; DG C-ARM 61-120 MIN

[cont. (1 of 2)]
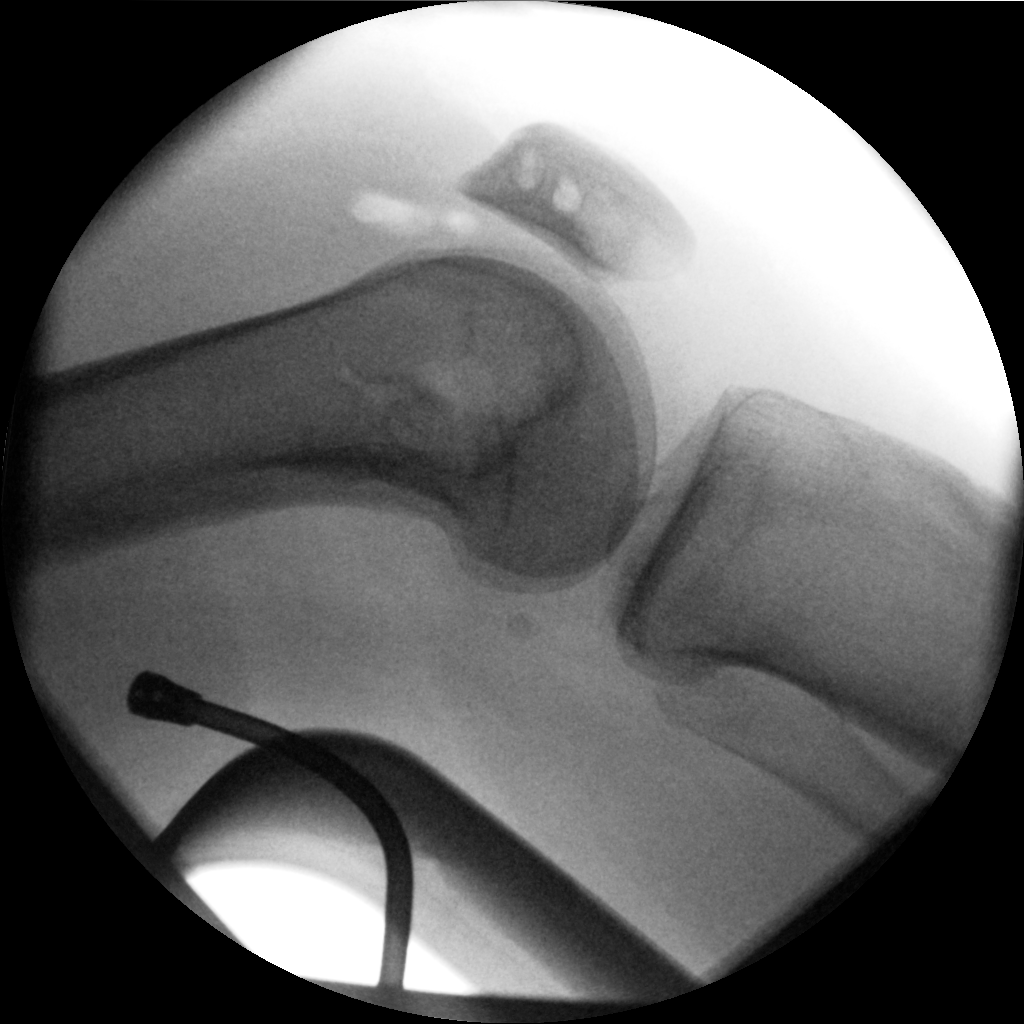

[cont. (2 of 2)]
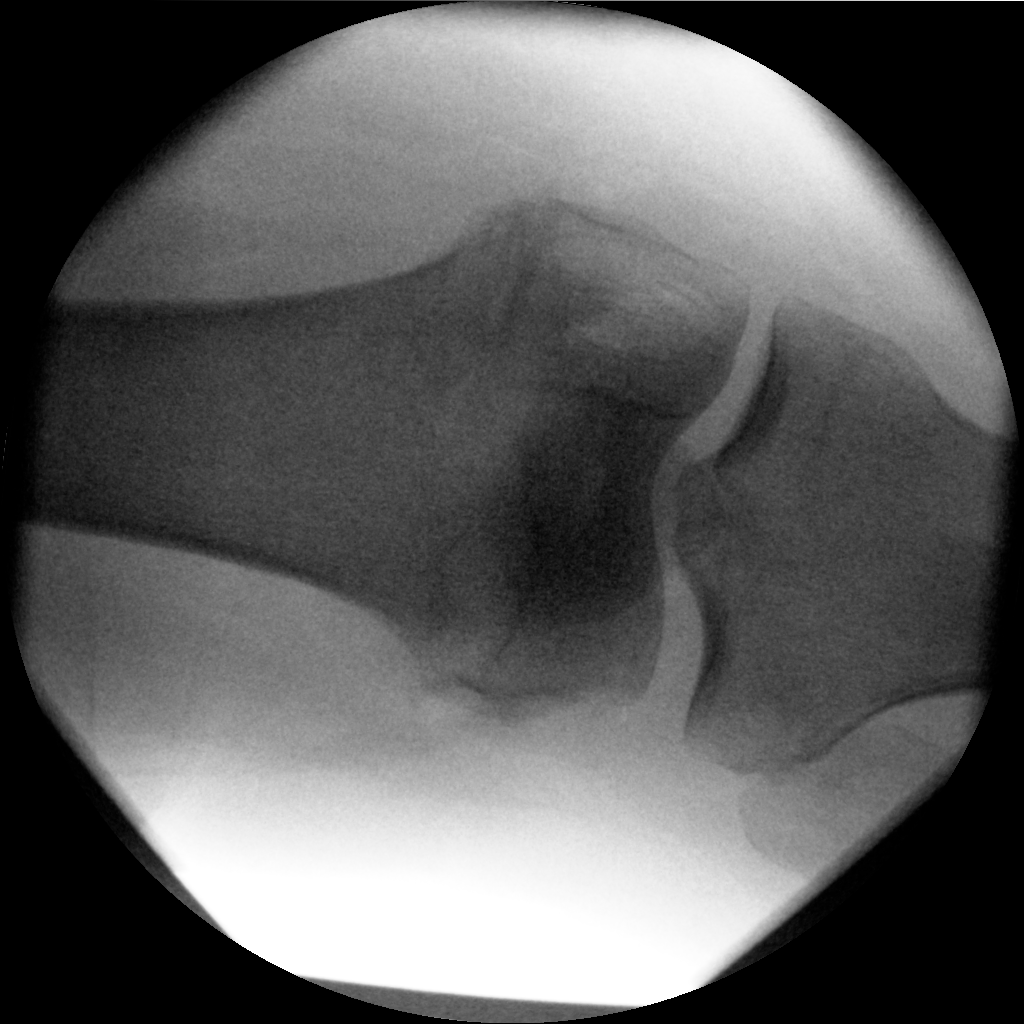

[2 of 2 positions shown; findings below may reference images not displayed]

FINDINGS: AP spot fluoroscopic view of the right knee. Joint spaces are
maintained. No acute bony abnormality visualized on the single
fluoroscopic view.
IMPRESSION: Spot fluoroscopic image of the right knee for operative purposes.

## 2016-12-22 ENCOUNTER — Other Ambulatory Visit: Payer: Self-pay | Admitting: Family Medicine

## 2016-12-22 DIAGNOSIS — Z1231 Encounter for screening mammogram for malignant neoplasm of breast: Secondary | ICD-10-CM

## 2017-01-17 ENCOUNTER — Ambulatory Visit
Admission: RE | Admit: 2017-01-17 | Discharge: 2017-01-17 | Disposition: A | Discharge status: Home / Self Care | Payer: BLUE CROSS/BLUE SHIELD | Source: Ambulatory Visit | Attending: Family Medicine | Admitting: Family Medicine

## 2017-01-17 DIAGNOSIS — Z1231 Encounter for screening mammogram for malignant neoplasm of breast: Secondary | ICD-10-CM | POA: Diagnosis not present

## 2017-07-03 ENCOUNTER — Ambulatory Visit: Payer: Self-pay | Admitting: Obstetrics and Gynecology

## 2017-07-04 ENCOUNTER — Ambulatory Visit: Payer: Self-pay | Admitting: Obstetrics and Gynecology

## 2017-07-25 ENCOUNTER — Encounter: Payer: Self-pay | Admitting: Obstetrics and Gynecology

## 2017-07-25 ENCOUNTER — Ambulatory Visit (INDEPENDENT_AMBULATORY_CARE_PROVIDER_SITE_OTHER): Payer: BLUE CROSS/BLUE SHIELD | Admitting: Obstetrics and Gynecology

## 2017-07-25 VITALS — BP 128/88 | HR 69 | Ht 60.0 in | Wt 182.0 lb

## 2017-07-25 DIAGNOSIS — Z01419 Encounter for gynecological examination (general) (routine) without abnormal findings: Secondary | ICD-10-CM

## 2017-07-25 DIAGNOSIS — Z1231 Encounter for screening mammogram for malignant neoplasm of breast: Secondary | ICD-10-CM | POA: Diagnosis not present

## 2017-07-25 DIAGNOSIS — Z803 Family history of malignant neoplasm of breast: Secondary | ICD-10-CM

## 2017-07-25 DIAGNOSIS — Z9189 Other specified personal risk factors, not elsewhere classified: Secondary | ICD-10-CM

## 2017-07-25 DIAGNOSIS — Z1239 Encounter for other screening for malignant neoplasm of breast: Secondary | ICD-10-CM

## 2017-07-25 NOTE — Progress Notes (Signed)
PCP:  Donnie Coffin, MD   Chief Complaint  Patient presents with  . Gynecologic Exam     HPI:      Ms. Meghan Thompson is a 42 y.o. G2P1011 who LMP was Patient's last menstrual period was 07/03/2017., presents today for her annual examination.  Her menses are regular every 28-30 days, lasting 5 days.  Dysmenorrhea none. She does not have intermenstrual bleeding.  Sex activity: has sex with females.  Last Pap: June 23, 2015  Results were: no abnormalities /neg HPV DNA  Hx of STDs: none  Last mammogram: January 17, 2017  Results were: normal--routine follow-up in 12 months There is a FH of breast cancer in her mother. There is no FH of ovarian cancer. Pt is MyRisk neg 2017, IBIS=22.3%. The patient does not do self-breast exams. She is taking Vit D supp. Has not had scr breast MRI (too late now for this yr).  Tobacco use: The patient denies current or previous tobacco use. Alcohol use: none No drug use.  Exercise: moderately active  She does get adequate calcium and Vitamin D in her diet.  Labs with PCP.    Past Medical History:  Diagnosis Date  . Asthma   . BRCA negative 2017   MyRisk neg  . Family history of adverse reaction to anesthesia    MOM HAS A HARD TIME WAKING UP  . Family history of breast cancer 2017  . GERD (gastroesophageal reflux disease)   . Hypertension   . Increased risk of breast cancer 2017   IBIS=22.3%    Past Surgical History:  Procedure Laterality Date  . BILATERAL CARPAL TUNNEL RELEASE    . CESAREAN SECTION    . CHOLECYSTECTOMY    . DILATION AND CURETTAGE OF UTERUS    . KNEE ARTHROSCOPY WITH MEDIAL PATELLAR FEMORAL LIGAMENT RECONSTRUCTION Right 02/03/2015   Procedure: KNEE ARTHROSCOPY WITH PARTIAL LASTERAL MENISECTOMY, MICROSCOPIC LATERAL RELEASE, MPFL RECONSTRUCTION;  Surgeon: Hessie Knows, MD;  Location: [facility] ORS;  Service: Orthopedics;  Laterality: Right;  . WISDOM TOOTH EXTRACTION      Family History  Problem Relation Age of  Onset  . Breast cancer Mother 54  . Diabetes Mother   . Hypertension Mother   . Hypertension Father   . Prostate cancer Father   . Brain cancer Maternal Uncle   . Brain cancer Maternal Uncle   . Lung cancer Maternal Uncle     Social History   Socioeconomic History  . Marital status: Significant Other    Spouse name: Not on file  . Number of children: Not on file  . Years of education: Not on file  . Highest education level: Not on file  Social Needs  . Financial resource strain: Not on file  . Food insecurity - worry: Not on file  . Food insecurity - inability: Not on file  . Transportation needs - medical: Not on file  . Transportation needs - non-medical: Not on file  Occupational History  . Not on file  Tobacco Use  . Smoking status: Never Smoker  . Smokeless tobacco: Never Used  Substance and Sexual Activity  . Alcohol use: No  . Drug use: No  . Sexual activity: Yes  Other Topics Concern  . Not on file  Social History Narrative  . Not on file    Current Meds  Medication Sig  . Albuterol (VENTOLIN IN) Inhale 2 puffs into the lungs as needed.  . fluticasone (FLONASE) 50 MCG/ACT nasal spray Place  1 spray into both nostrils as needed for allergies or rhinitis.  Marland Kitchen ibuprofen (ADVIL,MOTRIN) 200 MG tablet Take 200 mg by mouth every 6 (six) hours as needed.  Marland Kitchen lisinopril-hydrochlorothiazide (PRINZIDE,ZESTORETIC) 20-12.5 MG per tablet Take 1 tablet by mouth at bedtime.  . methocarbamol (ROBAXIN) 500 MG tablet Take 500 mg by mouth as needed for muscle spasms.  Marland Kitchen omeprazole (PRILOSEC) 10 MG capsule Take 10 mg by mouth as needed.     ROS:  Review of Systems  Constitutional: Negative for fatigue, fever and unexpected weight change.  Respiratory: Negative for cough, shortness of breath and wheezing.   Cardiovascular: Negative for chest pain, palpitations and leg swelling.  Gastrointestinal: Negative for blood in stool, constipation, diarrhea, nausea and vomiting.    Endocrine: Negative for cold intolerance, heat intolerance and polyuria.  Genitourinary: Negative for dyspareunia, dysuria, flank pain, frequency, genital sores, hematuria, menstrual problem, pelvic pain, urgency, vaginal bleeding, vaginal discharge and vaginal pain.  Musculoskeletal: Negative for back pain, joint swelling and myalgias.  Skin: Negative for rash.  Neurological: Negative for dizziness, syncope, light-headedness, numbness and headaches.  Hematological: Negative for adenopathy.  Psychiatric/Behavioral: Negative for agitation, confusion, sleep disturbance and suicidal ideas. The patient is not nervous/anxious.     Objective: BP 128/88   Pulse 69   Ht 5' (1.524 m)   Wt 182 lb (82.6 kg)   LMP 07/03/2017   BMI 35.54 kg/m    Physical Exam  Constitutional: She is oriented to person, place, and time. She appears well-developed and well-nourished.  Genitourinary: Vagina normal and uterus normal. There is no rash or tenderness on the right labia. There is no rash or tenderness on the left labia. No erythema or tenderness in the vagina. No vaginal discharge found. Right adnexum does not display mass and does not display tenderness. Left adnexum does not display mass and does not display tenderness. Cervix does not exhibit motion tenderness or polyp. Uterus is not enlarged or tender.  Neck: Normal range of motion. No thyromegaly present.  Cardiovascular: Normal rate, regular rhythm and normal heart sounds.  No murmur heard. Pulmonary/Chest: Effort normal and breath sounds normal. Right breast exhibits no mass, no nipple discharge, no skin change and no tenderness. Left breast exhibits no mass, no nipple discharge, no skin change and no tenderness.  Abdominal: Soft. There is no tenderness. There is no guarding.  Musculoskeletal: Normal range of motion.  Neurological: She is alert and oriented to person, place, and time. No cranial nerve deficit.  Psychiatric: She has a normal mood and  affect. Her behavior is normal.  Vitals reviewed.   Assessment/Plan: Encounter for annual routine gynecological examination  Screening for breast cancer - Pt to sched mammo. - Plan: MM SCREENING BREAST TOMO BILATERAL  Family history of breast cancer - Plan: MM SCREENING BREAST TOMO BILATERAL  Increased risk of breast cancer - IBIS=22%. Cont monthly SBE, yearly CBE and mammos. Offered scr breast MRI, f/u prn ref. Cont Vit D.      GYN counsel breast self exam, mammography screening, adequate intake of calcium and vitamin D, diet and exercise     F/U  Return in about 1 year (around 07/26/2018).  Alicia B. Copland, PA-C 07/25/2017 8:39 AM

## 2017-07-25 NOTE — Patient Instructions (Addendum)
I value your feedback and entrusting us with your care. If you get a [facility] patient survey, I would appreciate you taking the time to let us know about your experience today. Thank you!  [facility]: [phone]    

## 2018-02-06 ENCOUNTER — Encounter: Payer: Self-pay | Admitting: Obstetrics and Gynecology

## 2018-02-06 ENCOUNTER — Ambulatory Visit
Admission: RE | Admit: 2018-02-06 | Discharge: 2018-02-06 | Disposition: A | Discharge status: Home / Self Care | Payer: BLUE CROSS/BLUE SHIELD | Source: Ambulatory Visit | Attending: Obstetrics and Gynecology | Admitting: Obstetrics and Gynecology

## 2018-02-06 DIAGNOSIS — Z803 Family history of malignant neoplasm of breast: Secondary | ICD-10-CM | POA: Diagnosis present

## 2018-02-06 DIAGNOSIS — Z1231 Encounter for screening mammogram for malignant neoplasm of breast: Secondary | ICD-10-CM | POA: Diagnosis present

## 2018-02-06 DIAGNOSIS — Z1239 Encounter for other screening for malignant neoplasm of breast: Secondary | ICD-10-CM

## 2019-01-02 ENCOUNTER — Other Ambulatory Visit: Payer: Self-pay | Admitting: Family Medicine

## 2019-01-02 DIAGNOSIS — Z1231 Encounter for screening mammogram for malignant neoplasm of breast: Secondary | ICD-10-CM

## 2019-02-20 ENCOUNTER — Ambulatory Visit
Admission: RE | Admit: 2019-02-20 | Discharge: 2019-02-20 | Disposition: A | Discharge status: Home / Self Care | Payer: BC Managed Care – PPO | Source: Ambulatory Visit | Attending: Family Medicine | Admitting: Family Medicine

## 2019-02-20 DIAGNOSIS — Z1231 Encounter for screening mammogram for malignant neoplasm of breast: Secondary | ICD-10-CM | POA: Insufficient documentation

## 2019-09-03 IMAGING — MG MM DIGITAL SCREENING BILAT W/ TOMO W/ CAD
8 series · 8 of 24 positions shown · non-contrast
Comparison: Previous exam(s).

CLINICAL DATA: Screening.

EXAM:
DIGITAL SCREENING BILATERAL MAMMOGRAM WITH TOMO AND CAD

[L MLO synth-2D]
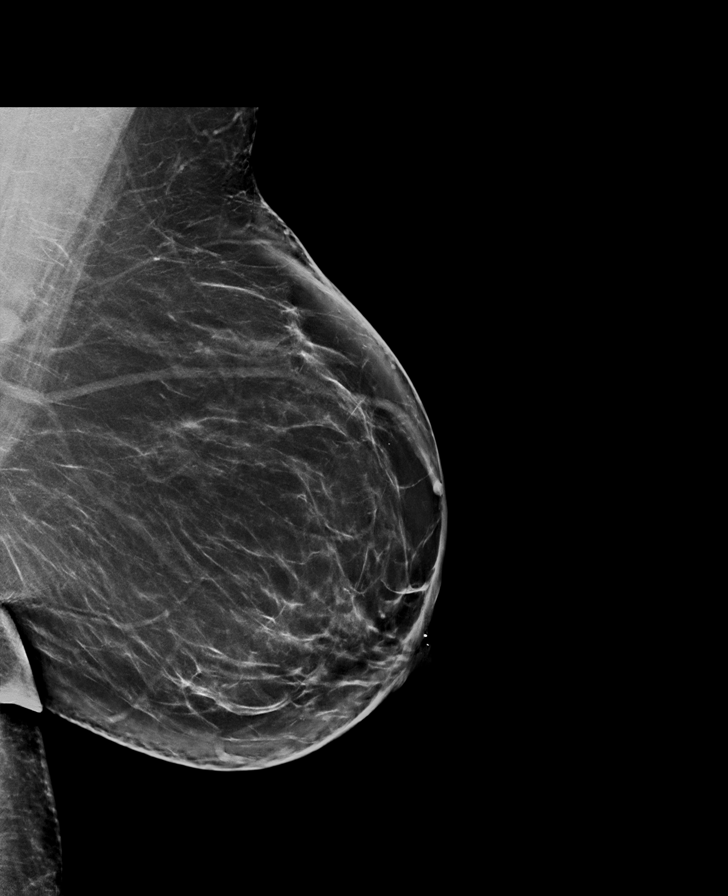

[L CC synth-2D]
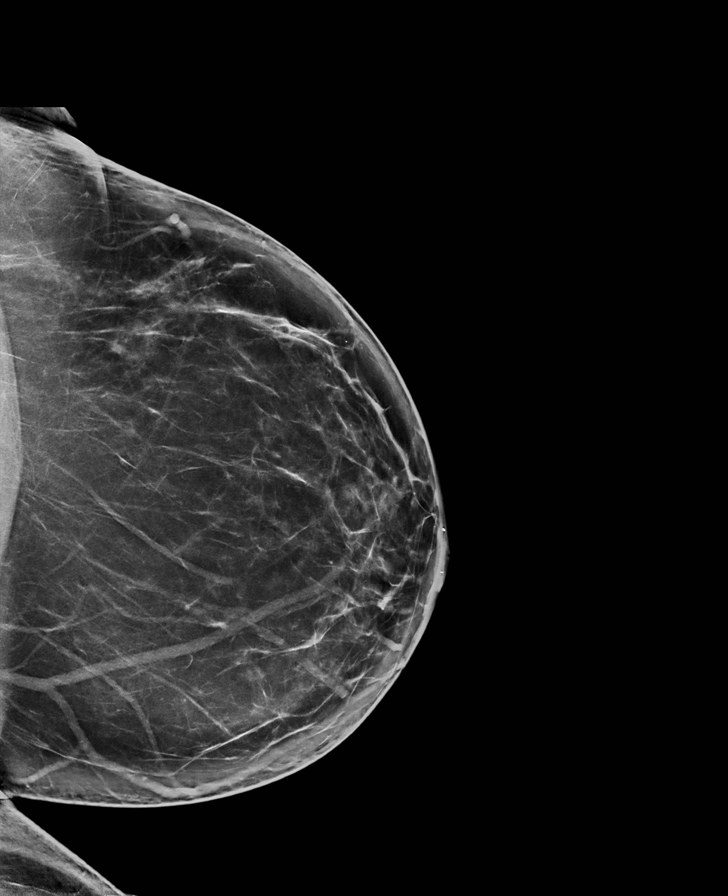

[R CC synth-2D]
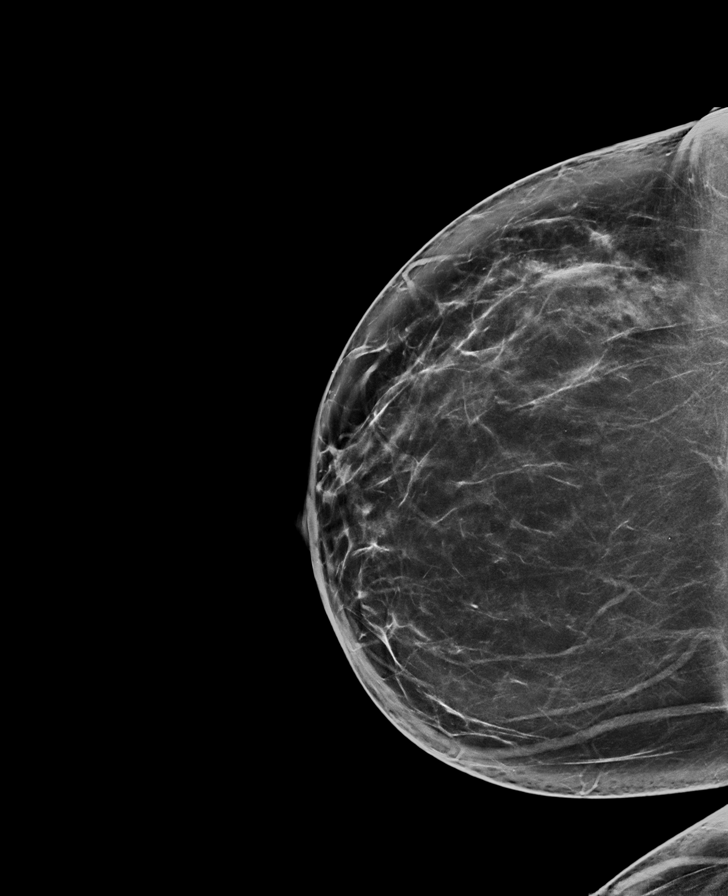

[R MLO synth-2D]
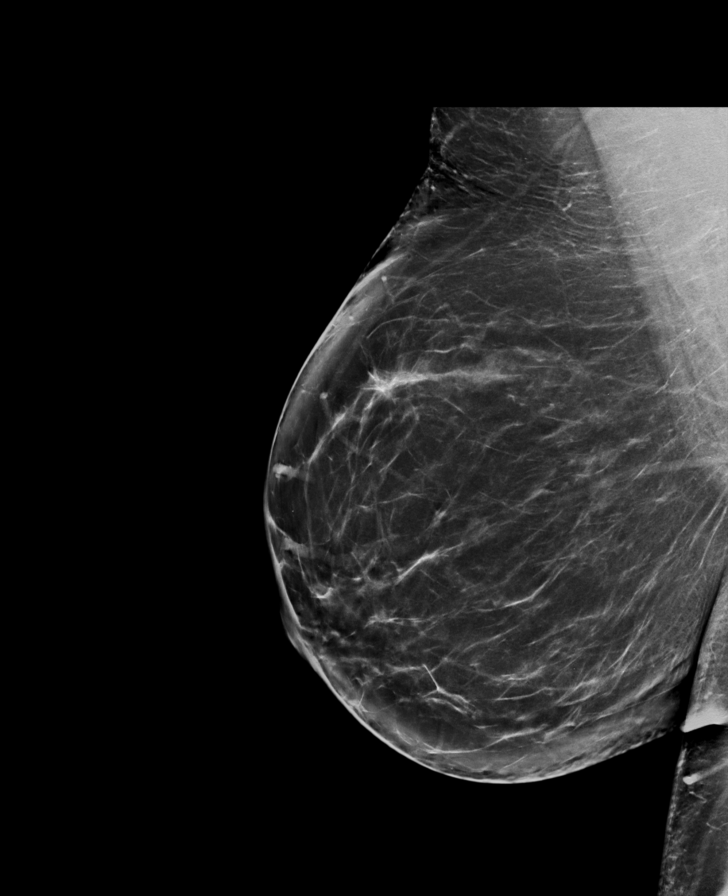

[L MLO tomo · tomo slice 50/99.0]
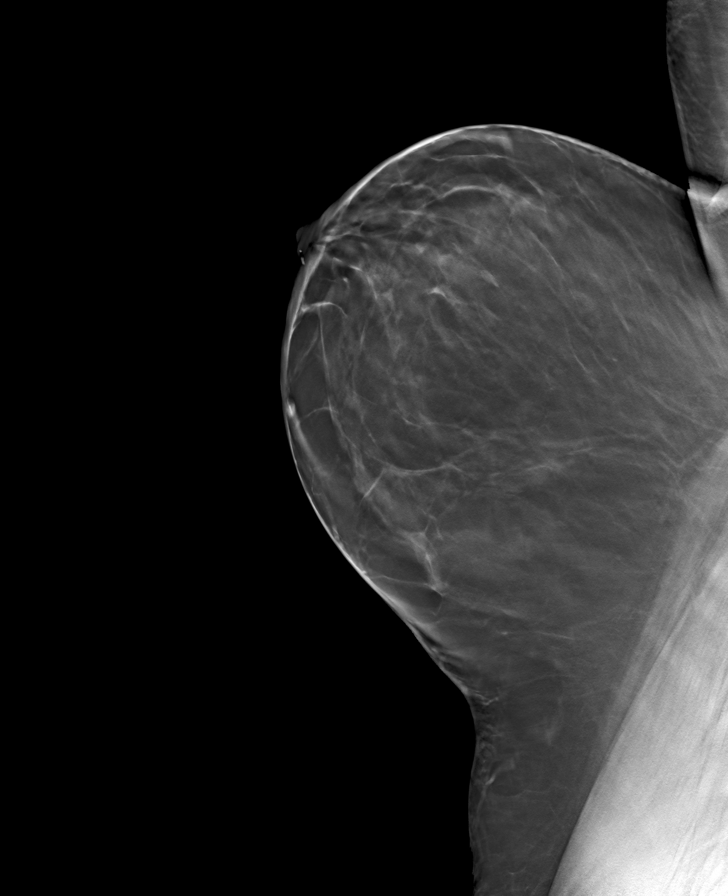

[R CC tomo · tomo slice 45/88.0]
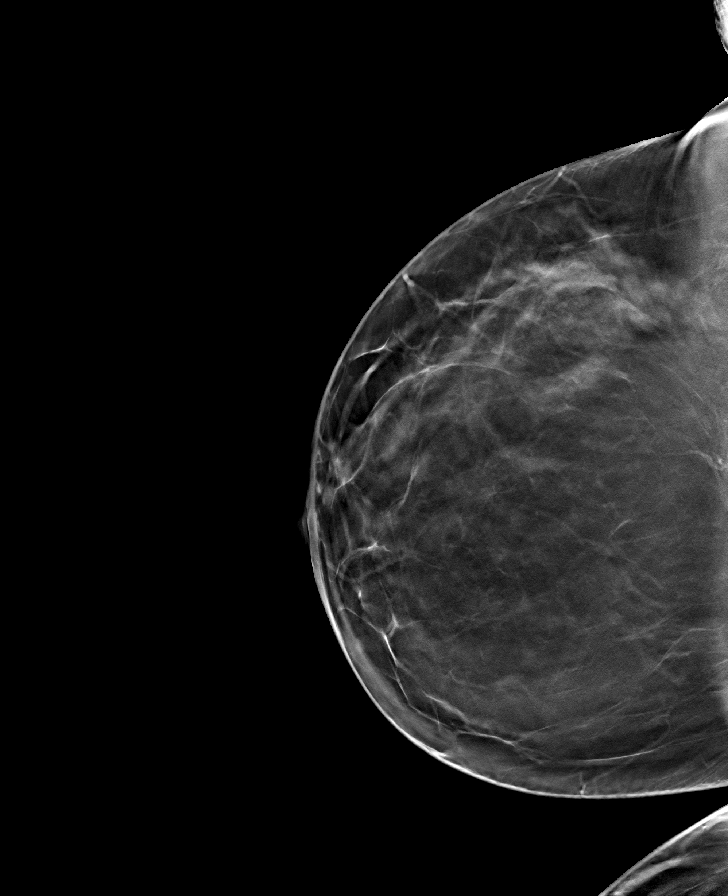

[L CC tomo · tomo slice 45/88.0]
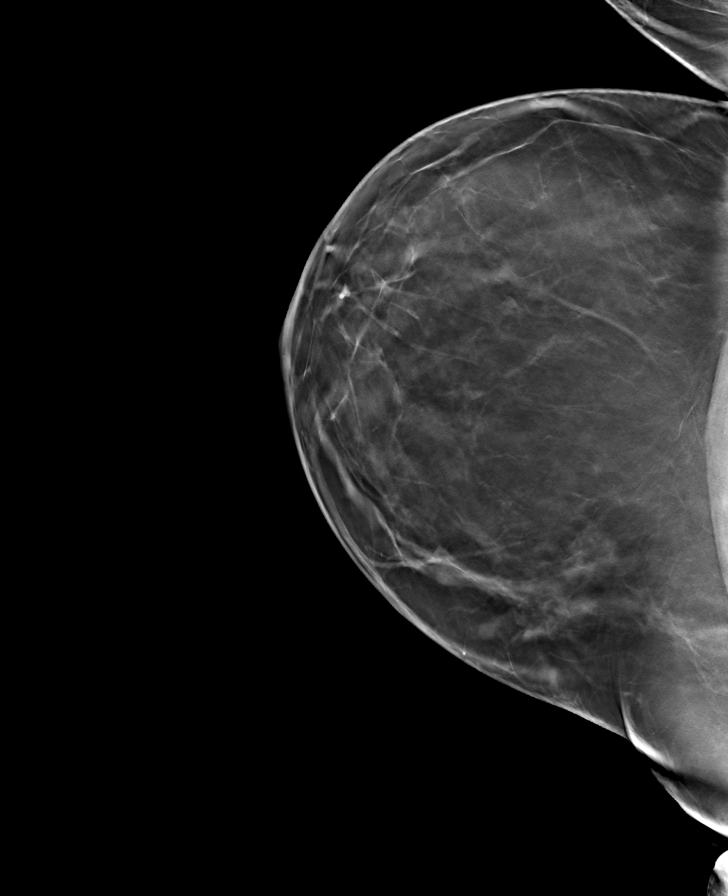

[R MLO tomo · tomo slice 49/98.0]
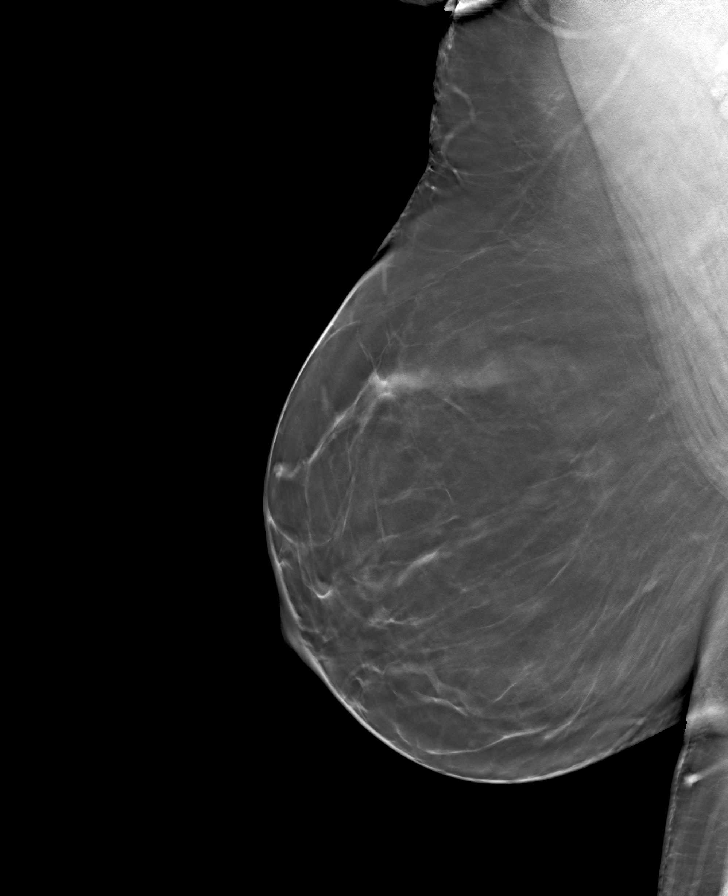

[8 of 24 positions shown; findings below may reference images not displayed]

ACR Breast Density Category b: There are scattered areas of
fibroglandular density.
FINDINGS: There are no findings suspicious for malignancy. Images were
processed with CAD.
IMPRESSION: No mammographic evidence of malignancy. A result letter of this
screening mammogram will be mailed directly to the patient.

RECOMMENDATION:
Screening mammogram in one year. (Code:[id number])

BI-RADS CATEGORY  1: Negative.

## 2020-03-24 ENCOUNTER — Telehealth: Payer: Self-pay | Admitting: Obstetrics & Gynecology

## 2020-03-24 NOTE — Telephone Encounter (Signed)
-----   Message from Nadara Mustard, MD sent at 03/24/2020  2:42 PM EST ----- Regarding: Sch Annual Pt desires a WED am appt. Sch w myself or ABC  Cell [phone]  Btw she has been trying to call to get appt but has not been able to get thru  [facility]

## 2020-03-24 NOTE — Telephone Encounter (Signed)
Called and left voicemail for patient to call back to be scheduled. 

## 2020-08-05 ENCOUNTER — Other Ambulatory Visit: Payer: Self-pay | Admitting: Family Medicine

## 2020-08-05 DIAGNOSIS — Z1231 Encounter for screening mammogram for malignant neoplasm of breast: Secondary | ICD-10-CM

## 2020-09-07 ENCOUNTER — Ambulatory Visit
Admission: RE | Admit: 2020-09-07 | Discharge: 2020-09-07 | Disposition: A | Discharge status: Home / Self Care | Payer: BC Managed Care – PPO | Source: Ambulatory Visit | Attending: Family Medicine | Admitting: Family Medicine

## 2020-09-07 ENCOUNTER — Other Ambulatory Visit: Payer: Self-pay

## 2020-09-07 DIAGNOSIS — Z1231 Encounter for screening mammogram for malignant neoplasm of breast: Secondary | ICD-10-CM | POA: Insufficient documentation

## 2020-12-03 ENCOUNTER — Other Ambulatory Visit
Admission: RE | Admit: 2020-12-03 | Discharge: 2020-12-03 | Disposition: A | Discharge status: Home / Self Care | Payer: BC Managed Care – PPO | Source: Ambulatory Visit | Attending: Rheumatology | Admitting: Rheumatology

## 2020-12-03 DIAGNOSIS — M059 Rheumatoid arthritis with rheumatoid factor, unspecified: Secondary | ICD-10-CM | POA: Insufficient documentation

## 2020-12-03 DIAGNOSIS — M7989 Other specified soft tissue disorders: Secondary | ICD-10-CM | POA: Diagnosis present

## 2020-12-03 LAB — BRAIN NATRIURETIC PEPTIDE: B Natriuretic Peptide: 49.7 pg/mL (ref 0.0–100.0)

## 2021-01-20 ENCOUNTER — Encounter: Payer: Self-pay | Admitting: *Deleted

## 2021-01-21 ENCOUNTER — Ambulatory Visit
Admission: RE | Admit: 2021-01-21 | Discharge: 2021-01-21 | Disposition: A | Discharge status: Home / Self Care | Payer: BC Managed Care – PPO | Attending: Gastroenterology | Admitting: Gastroenterology

## 2021-01-21 ENCOUNTER — Ambulatory Visit: Payer: BC Managed Care – PPO | Admitting: Anesthesiology

## 2021-01-21 ENCOUNTER — Encounter
Admission: RE | Disposition: A | Discharge status: Home / Self Care | Payer: Self-pay | Source: Home / Self Care | Attending: Gastroenterology

## 2021-01-21 ENCOUNTER — Encounter: Payer: Self-pay | Admitting: Anesthesiology

## 2021-01-21 DIAGNOSIS — Z881 Allergy status to other antibiotic agents status: Secondary | ICD-10-CM | POA: Diagnosis not present

## 2021-01-21 DIAGNOSIS — Z9049 Acquired absence of other specified parts of digestive tract: Secondary | ICD-10-CM | POA: Diagnosis not present

## 2021-01-21 DIAGNOSIS — Z7951 Long term (current) use of inhaled steroids: Secondary | ICD-10-CM | POA: Diagnosis not present

## 2021-01-21 DIAGNOSIS — Z79899 Other long term (current) drug therapy: Secondary | ICD-10-CM | POA: Insufficient documentation

## 2021-01-21 DIAGNOSIS — K640 First degree hemorrhoids: Secondary | ICD-10-CM | POA: Diagnosis not present

## 2021-01-21 DIAGNOSIS — Z1211 Encounter for screening for malignant neoplasm of colon: Secondary | ICD-10-CM | POA: Diagnosis present

## 2021-01-21 HISTORY — PX: COLONOSCOPY: SHX5424

## 2021-01-21 HISTORY — DX: Unspecified osteoarthritis, unspecified site: M19.90

## 2021-01-21 LAB — POCT PREGNANCY, URINE: Preg Test, Ur: NEGATIVE

## 2021-01-21 SURGERY — COLONOSCOPY
Anesthesia: General

## 2021-01-21 MED ORDER — PROPOFOL 10 MG/ML IV BOLUS
INTRAVENOUS | Status: AC
  Filled 2021-01-21: qty 20

## 2021-01-21 MED ORDER — LIDOCAINE HCL (PF) 1 % IJ SOLN
INTRAMUSCULAR | Status: AC
  Filled 2021-01-21: qty 2

## 2021-01-21 MED ORDER — PROPOFOL 500 MG/50ML IV EMUL
INTRAVENOUS | Status: AC
  Filled 2021-01-21: qty 50

## 2021-01-21 MED ORDER — FENTANYL CITRATE (PF) 100 MCG/2ML IJ SOLN
INTRAMUSCULAR | Status: AC
  Filled 2021-01-21: qty 2

## 2021-01-21 MED ORDER — PROPOFOL 500 MG/50ML IV EMUL
INTRAVENOUS | Status: DC | PRN
  Administered 2021-01-21: 150 ug/kg/min via INTRAVENOUS

## 2021-01-21 MED ORDER — FENTANYL CITRATE (PF) 100 MCG/2ML IJ SOLN
INTRAMUSCULAR | Status: DC | PRN
  Administered 2021-01-21: 50 ug via INTRAVENOUS

## 2021-01-21 MED ORDER — SODIUM CHLORIDE 0.9 % IV SOLN
INTRAVENOUS | Status: DC
  Administered 2021-01-21: 1000 mL via INTRAVENOUS

## 2021-01-21 NOTE — H&P (Signed)
[facility] Gastroenterology Pre-Procedure H&P   Patient ID: Meghan Thompson is a 45 y.o. female.  Gastroenterology Provider: Annamaria Helling, DO  Referring Provider: Octavia Bruckner, PA PCP: Donnie Coffin, MD  Date: 01/21/2021  HPI Ms. Meghan Thompson is a 45 y.o. female who presents today for Colonoscopy for initial screening colonoscopy.  Pt notes intermittent blood when wiping, but otherwise no melena/hematochezia, constipation, diarrhea, weight loss, night sweats.  No other acute gi complaints.  Past Medical History:  Diagnosis Date   Arthritis    Asthma    BRCA negative 2017   MyRisk neg   Family history of adverse reaction to anesthesia    MOM HAS A HARD TIME WAKING UP   Family history of breast cancer 2017   GERD (gastroesophageal reflux disease)    Hypertension    Increased risk of breast cancer 2017   IBIS=22.3%    Past Surgical History:  Procedure Laterality Date   BILATERAL CARPAL TUNNEL RELEASE     CESAREAN SECTION     CHOLECYSTECTOMY     DILATION AND CURETTAGE OF UTERUS     KNEE ARTHROSCOPY WITH MEDIAL PATELLAR FEMORAL LIGAMENT RECONSTRUCTION Right 02/03/2015   Procedure: KNEE ARTHROSCOPY WITH PARTIAL LASTERAL MENISECTOMY, MICROSCOPIC LATERAL RELEASE, MPFL RECONSTRUCTION;  Surgeon: Hessie Knows, MD;  Location: [facility] ORS;  Service: Orthopedics;  Laterality: Right;   WISDOM TOOTH EXTRACTION      Family History No h/o GI disease or malignancy  Review of Systems  Constitutional:  Negative for activity change, appetite change, fatigue, fever and unexpected weight change.  HENT:  Negative for trouble swallowing and voice change.   Respiratory:  Negative for shortness of breath and wheezing.   Cardiovascular:  Negative for chest pain, palpitations and leg swelling.  Gastrointestinal:  Negative for abdominal distention, abdominal pain, anal bleeding, blood in stool, constipation, diarrhea, nausea, rectal pain and vomiting.       Blood with wiping- intermittent   Musculoskeletal:  Negative for arthralgias and myalgias.  Skin:  Negative for color change and pallor.  Neurological:  Negative for dizziness, syncope and weakness.  Psychiatric/Behavioral:  Negative for agitation and confusion.   All other systems reviewed and are negative.   Medications No current facility-administered medications on file prior to encounter.   Current Outpatient Medications on File Prior to Encounter  Medication Sig Dispense Refill   Ascorbic Acid (VITAMIN C) 100 MG tablet Take 100 mg by mouth daily.     Budeson-Glycopyrrol-Formoterol (BREZTRI AEROSPHERE) 160-9-4.8 MCG/ACT AERO Inhale 2 puffs into the lungs in the morning and at bedtime.     budesonide (PULMICORT) 0.5 MG/2ML nebulizer solution Take 0.5 mg by nebulization daily.     Etanercept (ENBREL MINI) 50 MG/ML SOCT Inject 1 mL into the skin once a week.     hydroxychloroquine (PLAQUENIL) 200 MG tablet Take 200 mg by mouth 2 (two) times daily.     ipratropium (ATROVENT) 0.03 % nasal spray Place 2 sprays into both nostrils 3 (three) times daily as needed for rhinitis.     montelukast (SINGULAIR) 10 MG tablet Take 10 mg by mouth at bedtime.     Albuterol (VENTOLIN IN) Inhale 2 puffs into the lungs as needed.     fluticasone (FLONASE) 50 MCG/ACT nasal spray Place 1 spray into both nostrils as needed for allergies or rhinitis.     ibuprofen (ADVIL,MOTRIN) 200 MG tablet Take 200 mg by mouth every 6 (six) hours as needed.     lisinopril-hydrochlorothiazide (PRINZIDE,ZESTORETIC) 20-12.5 MG per  tablet Take 1 tablet by mouth at bedtime.     methocarbamol (ROBAXIN) 500 MG tablet Take 500 mg by mouth as needed for muscle spasms.     omeprazole (PRILOSEC) 10 MG capsule Take 10 mg by mouth as needed.     oxyCODONE-acetaminophen (ROXICET) 5-325 MG per tablet Take 1-2 tablets by mouth every 6 (six) hours as needed for severe pain. (Patient not taking: Reported on 07/25/2017) 60 tablet 0    Pertinent medications related to GI and  procedure were reviewed by me with the patient prior to the procedure   Current Facility-Administered Medications:    0.9 %  sodium chloride infusion, , Intravenous, Continuous, Annamaria Helling, DO, Last Rate: 20 mL/hr at 01/21/21 0835, Continued from Pre-op at 01/21/21 0835   lidocaine (PF) (XYLOCAINE) 1 % injection, , , ,   sodium chloride 20 mL/hr at 01/21/21 5400       Allergies  Allergen Reactions   Ciprofloxacin Rash   Citalopram Rash   Allergies were reviewed by me prior to the procedure  Objective    Vitals:   01/20/21 1402 01/21/21 0755  BP:  (!) 159/88  Pulse:  72  Resp:  16  Temp:  (!) 97 F (36.1 C)  TempSrc:  Tympanic  SpO2:  100%  Weight: 99.8 kg 97.5 kg  Height: 5' (1.524 m) 5' (1.524 m)     Physical Exam Vitals reviewed.  Constitutional:      General: She is not in acute distress.    Appearance: Normal appearance. She is obese. She is not ill-appearing, toxic-appearing or diaphoretic.  HENT:     Head: Normocephalic and atraumatic.     Nose: Nose normal.     Mouth/Throat:     Mouth: Mucous membranes are moist.     Pharynx: Oropharynx is clear.  Eyes:     General: No scleral icterus.    Extraocular Movements: Extraocular movements intact.  Cardiovascular:     Rate and Rhythm: Normal rate and regular rhythm.     Heart sounds: Normal heart sounds. No murmur heard.   No friction rub. No gallop.  Pulmonary:     Effort: Pulmonary effort is normal. No respiratory distress.     Breath sounds: Normal breath sounds. No wheezing, rhonchi or rales.  Abdominal:     General: Bowel sounds are normal. There is no distension.     Palpations: Abdomen is soft.     Tenderness: There is no abdominal tenderness. There is no guarding or rebound.  Musculoskeletal:     Cervical back: Neck supple.     Right lower leg: No edema.     Left lower leg: No edema.  Skin:    General: Skin is warm and dry.     Coloration: Skin is not jaundiced or pale.   Neurological:     General: No focal deficit present.     Mental Status: She is alert and oriented to person, place, and time. Mental status is at baseline.  Psychiatric:        Mood and Affect: Mood normal.        Behavior: Behavior normal.        Thought Content: Thought content normal.        Judgment: Judgment normal.     Assessment:  Ms. Meghan Thompson is a 44 y.o. female  who presents today for Colonoscopy for initial screening colonoscopy.  Plan:  Colonoscopy with possible intervention today  Colonoscopy with possible biopsy, control of  bleeding, polypectomy, and interventions as necessary has been discussed with the patient/patient representative. Informed consent was obtained from the patient/patient representative after explaining the indication, nature, and risks of the procedure including but not limited to death, bleeding, perforation, missed neoplasm/lesions, cardiorespiratory compromise, and reaction to medications. Opportunity for questions was given and appropriate answers were provided. Patient/patient representative has verbalized understanding is amenable to undergoing the procedure.  Annamaria Helling, DO  [facility] Gastroenterology  Portions of the record may have been created with voice recognition software. Occasional wrong-word or 'sound-a-like' substitutions may have occurred due to the inherent limitations of voice recognition software.  Read the chart carefully and recognize, using context, where substitutions may have occurred.

## 2021-01-21 NOTE — Anesthesia Preprocedure Evaluation (Addendum)
Anesthesia Evaluation  Patient identified by MRN, date of birth, ID band Patient awake    Reviewed: Allergy & Precautions, NPO status , Patient's Chart, lab work & pertinent test results  Airway Mallampati: III  TM Distance: >3 FB Neck ROM: Full    Dental  (+) Caps   Pulmonary asthma , sleep apnea and Continuous Positive Airway Pressure Ventilation ,    Pulmonary exam normal        Cardiovascular hypertension, Pt. on medications Normal cardiovascular exam Rhythm:Regular Rate:Normal     Neuro/Psych negative neurological ROS  negative psych ROS   GI/Hepatic Neg liver ROS, GERD  Medicated and Controlled,  Endo/Other  Morbid obesity  Renal/GU negative Renal ROS  negative genitourinary   Musculoskeletal  (+) Arthritis , Osteoarthritis and Rheumatoid disorders,    Abdominal (+) + obese,   Peds  Hematology   Anesthesia Other Findings   Reproductive/Obstetrics negative OB ROS                            Anesthesia Physical Anesthesia Plan  ASA: 3  Anesthesia Plan: General   Post-op Pain Management:    Induction: Intravenous  PONV Risk Score and Plan: Treatment may vary due to age or medical condition  Airway Management Planned:   Additional Equipment:   Intra-op Plan:   Post-operative Plan:   Informed Consent: I have reviewed the patients History and Physical, chart, labs and discussed the procedure including the risks, benefits and alternatives for the proposed anesthesia with the patient or authorized representative who has indicated his/her understanding and acceptance.     Dental advisory given  Plan Discussed with: CRNA and Anesthesiologist  Anesthesia Plan Comments:         Anesthesia Quick Evaluation

## 2021-01-21 NOTE — Transfer of Care (Signed)
Immediate Anesthesia Transfer of Care Note  Patient: Meghan Thompson  Procedure(s) Performed: COLONOSCOPY  Patient Location: PACU  Anesthesia Type:General  Level of Consciousness: awake and sedated  Airway & Oxygen Therapy: Patient Spontanous Breathing and Patient connected to face mask oxygen  Post-op Assessment: Report given to RN and Post -op Vital signs reviewed and stable  Post vital signs: Reviewed and stable  Last Vitals:  Vitals Value Taken Time  BP 132/74 01/21/21 0913  Temp 36.1 C 01/21/21 0912  Pulse 70 01/21/21 0916  Resp 16 01/21/21 0916  SpO2 99 % 01/21/21 0916  Vitals shown include unvalidated device data.  Last Pain:  Vitals:   01/21/21 0912  TempSrc:   PainSc: Asleep         Complications: No notable events documented.

## 2021-01-21 NOTE — Op Note (Signed)
[facility] Gastroenterology Patient Name: Meghan Thompson Procedure Date: 01/21/2021 8:18 AM MRN: [id number] Account #: [id number] Date of Birth: 12-28-1975 Admit Type: Outpatient Age: 45 Room: [facility] ENDO ROOM 1 Gender: Female Note Status: Finalized Instrument Name: Colonscope [id number] Procedure:             Colonoscopy Indications:           Screening for colorectal malignant neoplasm Providers:             Annamaria Helling DO, DO Referring MD:          Ngwe A. Aycock MD (Referring MD) Medicines:             Monitored Anesthesia Care Complications:         No immediate complications. Estimated blood loss: None. Procedure:             Pre-Anesthesia Assessment:                        - Prior to the procedure, a History and Physical was                         performed, and patient medications and allergies were                         reviewed. The patient is competent. The risks and                         benefits of the procedure and the sedation options and                         risks were discussed with the patient. All questions                         were answered and informed consent was obtained.                         Patient identification and proposed procedure were                         verified by the physician, the nurse, the anesthetist                         and the technician in the endoscopy suite. Mental                         Status Examination: alert and oriented. Airway                         Examination: normal oropharyngeal airway and neck                         mobility. Respiratory Examination: clear to                         auscultation. CV Examination: RRR, no murmurs, no S3                         or S4. Prophylactic Antibiotics: The patient does not  require prophylactic antibiotics. Prior                         Anticoagulants: The patient has taken no previous                          anticoagulant or antiplatelet agents. ASA Grade                         Assessment: III - A patient with severe systemic                         disease. After reviewing the risks and benefits, the                         patient was deemed in satisfactory condition to                         undergo the procedure. The anesthesia plan was to use                         monitored anesthesia care (MAC). Immediately prior to                         administration of medications, the patient was                         re-assessed for adequacy to receive sedatives. The                         heart rate, respiratory rate, oxygen saturations,                         blood pressure, adequacy of pulmonary ventilation, and                         response to care were monitored throughout the                         procedure. The physical status of the patient was                         re-assessed after the procedure.                        After obtaining informed consent, the colonoscope was                         passed under direct vision. Throughout the procedure,                         the patient's blood pressure, pulse, and oxygen                         saturations were monitored continuously. The                         Colonoscope was introduced through the anus and  advanced to the the cecum, identified by appendiceal                         orifice and ileocecal valve. The colonoscopy was                         performed without difficulty. The patient tolerated                         the procedure well. The quality of the bowel                         preparation was evaluated using the BBPS [facility] Bowel                         Preparation Scale) with scores of: Right Colon = 3,                         Transverse Colon = 3 and Left Colon = 3 (entire mucosa                         seen well with no residual staining, small fragments                          of stool or opaque liquid). The total BBPS score                         equals 9. The ileocecal valve, appendiceal orifice,                         and rectum were photographed. Findings:      The perianal and digital rectal examinations were normal. Pertinent       negatives include normal sphincter tone.      Non-bleeding internal hemorrhoids were found during retroflexion. The       hemorrhoids were Grade I (internal hemorrhoids that do not prolapse).       Estimated blood loss: none.      The entire examined colon appeared normal on direct and retroflexion       views. Impression:            - Non-bleeding internal hemorrhoids.                        - The entire examined colon is normal on direct and                         retroflexion views.                        - No specimens collected. Recommendation:        - Discharge patient to home.                        - Resume previous diet.                        - Continue present medications.                        -  Repeat colonoscopy in 10 years for screening                         purposes.                        - Return to referring physician as previously                         scheduled. Procedure Code(s):     --- Professional ---                        H6579, Colorectal cancer screening; colonoscopy on                         individual not meeting criteria for high risk Diagnosis Code(s):     --- Professional ---                        Z12.11, Encounter for screening for malignant neoplasm                         of colon                        K64.0, First degree hemorrhoids CPT copyright 2019 American Medical Association. All rights reserved. The codes documented in this report are preliminary and upon coder review may  be revised to meet current compliance requirements. Attending Participation:      I personally performed the entire procedure. Volney American, DO Annamaria Helling DO, DO 01/21/2021 9:11:28  AM This report has been signed electronically. Number of Addenda: 0 Note Initiated On: 01/21/2021 8:18 AM Scope Withdrawal Time: 0 hours 10 minutes 20 seconds  Total Procedure Duration: 0 hours 21 minutes 33 seconds  Estimated Blood Loss:  Estimated blood loss: none.      [facility]

## 2021-01-21 NOTE — Anesthesia Procedure Notes (Signed)
Date/Time: 01/21/2021 8:51 AM Performed by: Tonia Ghent Pre-anesthesia Checklist: Patient identified, Emergency Drugs available, Suction available, Patient being monitored and Timeout performed Patient Re-evaluated:Patient Re-evaluated prior to induction Oxygen Delivery Method: Supernova nasal CPAP Preoxygenation: Pre-oxygenation with 100% oxygen Induction Type: IV induction Placement Confirmation: positive ETCO2 and CO2 detector

## 2021-01-21 NOTE — Interval H&P Note (Signed)
History and Physical Interval Note: Preprocedure H&P from 01/21/21  was reviewed and there was no interval change after seeing and examining the patient.  Written consent was obtained from the patient after discussion of risks, benefits, and alternatives. Patient has consented to proceed with Colonoscopy with possible intervention   01/21/2021 8:39 AM  Meghan Thompson  has presented today for surgery, with the diagnosis of (Z12.11) COLON CANCER SCREENING.  The various methods of treatment have been discussed with the patient and family. After consideration of risks, benefits and other options for treatment, the patient has consented to  Procedure(s): COLONOSCOPY (N/A) as a surgical intervention.  The patient's history has been reviewed, patient examined, no change in status, stable for surgery.  I have reviewed the patient's chart and labs.  Questions were answered to the patient's satisfaction.     Jaynie Collins

## 2021-01-21 NOTE — Anesthesia Postprocedure Evaluation (Signed)
Anesthesia Post Note  Patient: Demari D Westerfield  Procedure(s) Performed: COLONOSCOPY  Patient location during evaluation: Endoscopy Anesthesia Type: General Level of consciousness: awake and alert Pain management: pain level controlled Vital Signs Assessment: post-procedure vital signs reviewed and stable Respiratory status: spontaneous breathing, nonlabored ventilation and respiratory function stable Cardiovascular status: blood pressure returned to baseline and stable Postop Assessment: no apparent nausea or vomiting Anesthetic complications: no   No notable events documented.   Last Vitals:  Vitals:   01/21/21 0755 01/21/21 0912  BP: (!) 159/88 132/74  Pulse: 72   Resp: 16   Temp: (!) 36.1 C (!) 36.1 C  SpO2: 100%     Last Pain:  Vitals:   01/21/21 0942  TempSrc:   PainSc: 0-No pain                 Foye Deer

## 2021-01-22 ENCOUNTER — Encounter: Payer: Self-pay | Admitting: Gastroenterology

## 2021-09-08 ENCOUNTER — Other Ambulatory Visit: Payer: Self-pay | Admitting: Family Medicine

## 2021-09-08 DIAGNOSIS — Z1231 Encounter for screening mammogram for malignant neoplasm of breast: Secondary | ICD-10-CM

## 2021-09-10 ENCOUNTER — Emergency Department
Admission: EM | Admit: 2021-09-10 | Discharge: 2021-09-10 | Disposition: A | Discharge status: Home / Self Care | Payer: BC Managed Care – PPO | Attending: Emergency Medicine | Admitting: Emergency Medicine

## 2021-09-10 ENCOUNTER — Other Ambulatory Visit: Payer: Self-pay

## 2021-09-10 ENCOUNTER — Encounter: Payer: Self-pay | Admitting: Emergency Medicine

## 2021-09-10 ENCOUNTER — Emergency Department: Payer: BC Managed Care – PPO

## 2021-09-10 DIAGNOSIS — R1010 Upper abdominal pain, unspecified: Secondary | ICD-10-CM | POA: Diagnosis present

## 2021-09-10 DIAGNOSIS — J45909 Unspecified asthma, uncomplicated: Secondary | ICD-10-CM | POA: Insufficient documentation

## 2021-09-10 DIAGNOSIS — R110 Nausea: Secondary | ICD-10-CM | POA: Diagnosis not present

## 2021-09-10 DIAGNOSIS — I10 Essential (primary) hypertension: Secondary | ICD-10-CM | POA: Diagnosis not present

## 2021-09-10 DIAGNOSIS — R1013 Epigastric pain: Secondary | ICD-10-CM | POA: Insufficient documentation

## 2021-09-10 LAB — CBC
HCT: 39.4 % (ref 36.0–46.0)
Hemoglobin: 12.5 g/dL (ref 12.0–15.0)
MCH: 26.2 pg (ref 26.0–34.0)
MCHC: 31.7 g/dL (ref 30.0–36.0)
MCV: 82.4 fL (ref 80.0–100.0)
Platelets: 272 10*3/uL (ref 150–400)
RBC: 4.78 MIL/uL (ref 3.87–5.11)
RDW: 13.5 % (ref 11.5–15.5)
WBC: 10.9 10*3/uL — ABNORMAL HIGH (ref 4.0–10.5)
nRBC: 0 % (ref 0.0–0.2)

## 2021-09-10 LAB — URINALYSIS, ROUTINE W REFLEX MICROSCOPIC
Bacteria, UA: NONE SEEN
Bilirubin Urine: NEGATIVE
Glucose, UA: NEGATIVE mg/dL
Hgb urine dipstick: NEGATIVE
Ketones, ur: NEGATIVE mg/dL
Nitrite: NEGATIVE
Protein, ur: NEGATIVE mg/dL
Specific Gravity, Urine: 1.019 (ref 1.005–1.030)
pH: 5 (ref 5.0–8.0)

## 2021-09-10 LAB — COMPREHENSIVE METABOLIC PANEL
ALT: 22 U/L (ref 0–44)
AST: 20 U/L (ref 15–41)
Albumin: 3.7 g/dL (ref 3.5–5.0)
Alkaline Phosphatase: 54 U/L (ref 38–126)
Anion gap: 7 (ref 5–15)
BUN: 15 mg/dL (ref 6–20)
CO2: 24 mmol/L (ref 22–32)
Calcium: 8.8 mg/dL — ABNORMAL LOW (ref 8.9–10.3)
Chloride: 104 mmol/L (ref 98–111)
Creatinine, Ser: 0.62 mg/dL (ref 0.44–1.00)
GFR, Estimated: >60 mL/min (ref >60)
Glucose, Bld: 129 mg/dL — ABNORMAL HIGH (ref 70–99)
Potassium: 3.7 mmol/L (ref 3.5–5.1)
Sodium: 135 mmol/L (ref 135–145)
Total Bilirubin: 0.3 mg/dL (ref 0.3–1.2)
Total Protein: 7.4 g/dL (ref 6.5–8.1)

## 2021-09-10 LAB — POC URINE PREG, ED: Preg Test, Ur: NEGATIVE

## 2021-09-10 LAB — LIPASE, BLOOD: Lipase: 29 U/L (ref 11–51)

## 2021-09-10 LAB — TROPONIN I (HIGH SENSITIVITY): Troponin I (High Sensitivity): 3 ng/L (ref <18)

## 2021-09-10 MED ORDER — SUCRALFATE 1 G PO TABS: 1.0000 g | ORAL_TABLET | Freq: Four times a day (QID) | ORAL | 11 refills | Status: AC | PRN

## 2021-09-10 MED ORDER — METOCLOPRAMIDE HCL 10 MG PO TABS: 10.0000 mg | ORAL_TABLET | Freq: Three times a day (TID) | ORAL | 0 refills | Status: AC | PRN

## 2021-09-10 MED ORDER — DICYCLOMINE HCL 10 MG PO CAPS: 10.0000 mg | ORAL_CAPSULE | Freq: Three times a day (TID) | ORAL | 0 refills | Status: AC | PRN

## 2021-09-10 MED ORDER — DICYCLOMINE HCL 10 MG PO CAPS
10.0000 mg | ORAL_CAPSULE | Freq: Once | ORAL | Status: AC
  Administered 2021-09-10: 10 mg via ORAL
  Filled 2021-09-10: qty 1

## 2021-09-10 MED ORDER — SUCRALFATE 1 G PO TABS
1.0000 g | ORAL_TABLET | Freq: Once | ORAL | Status: AC
  Administered 2021-09-10: 1 g via ORAL
  Filled 2021-09-10: qty 1

## 2021-09-10 MED ORDER — IOHEXOL 300 MG/ML  SOLN
100.0000 mL | Freq: Once | INTRAMUSCULAR | Status: AC | PRN
  Administered 2021-09-10: 100 mL via INTRAVENOUS

## 2021-09-10 NOTE — ED Triage Notes (Signed)
Pt to ED from home c/o mid abd pain radiating to RUQ.  States had hernia with pregnancy and thought this was the pain.  States indigestion for last month have been worse and clearing throat and non productive cough.  Pain is tight, SOB, diarrhea but denies n/v, denies fever.  Denies urinary changes.  States has appt with Dr. Lysle Pearl for consult on 5/23.  Pt A&Ox4, chest rise even and unlabored, skin WNL and in NAD at this time. ?

## 2021-09-10 NOTE — ED Provider Notes (Addendum)
? ?[facility] ?Provider Note ? ? ? Event Date/Time  ? First MD Initiated Contact with Patient 09/10/21 1747   ?  (approximate) ? ? ?History  ? ?Abdominal Pain ? ? ?HPI ? ?Meghan Thompson is a 46 y.o. female with a history of GERD, hypertension, asthma, and arthritis who presents with persistent abdominal pain worsening over the last month, mainly in the central and upper abdomen, worse after eating, and associated with a sensation of bloating.  She also reports associated sour taste in the back of her mouth at times.  She reports some nausea but denies vomiting.  She has no diarrhea, blood in the stool, or other change in her bowel movements.  She denies any fever or chills.  She has a history of a hernia after pregnancy and states this feels similar.  She previously was on omeprazole and was recently switched to pantoprazole a few days ago but these have not helped so far.   ? ? ? ?Physical Exam  ? ?Triage Vital Signs: ?ED Triage Vitals [09/10/21 1709]  ?Enc Vitals Group  ?   BP 138/83  ?   Pulse Rate 90  ?   Resp 16  ?   Temp 99.1 ?F (37.3 ?C)  ?   Temp Source Oral  ?   SpO2 92 %  ?   Weight 207 lb (93.9 kg)  ?   Height 5' (1.524 m)  ?   Head Circumference   ?   Peak Flow   ?   Pain Score 10  ?   Pain Loc   ?   Pain Edu?   ?   Excl. in GC?   ? ? ?Most recent vital signs: ?Vitals:  ? 09/10/21 1709 09/10/21 2024  ?BP: 138/83 128/77  ?Pulse: 90 85  ?Resp: 16 17  ?Temp: 99.1 ?F (37.3 ?C)   ?SpO2: 92% 97%  ? ? ? ?General: Alert and oriented, well-appearing. ?CV:  Good peripheral perfusion.  ?Resp:  Normal effort.  ?Abd:  Soft with mild epigastric tenderness.  No distention.  ?Other:  No scleral icterus or jaundice.  Moist mucous membranes. ? ? ?ED Results / Procedures / Treatments  ? ?Labs ?(all labs ordered are listed, but only abnormal results are displayed) ?Labs Reviewed  ?COMPREHENSIVE METABOLIC PANEL - Abnormal; Notable for the following components:  ?    Result Value  ? Glucose, Bld 129  (*)   ? Calcium 8.8 (*)   ? All other components within normal limits  ?CBC - Abnormal; Notable for the following components:  ? WBC 10.9 (*)   ? All other components within normal limits  ?URINALYSIS, ROUTINE W REFLEX MICROSCOPIC - Abnormal; Notable for the following components:  ? Color, Urine YELLOW (*)   ? APPearance CLEAR (*)   ? Leukocytes,Ua TRACE (*)   ? All other components within normal limits  ?LIPASE, BLOOD  ?POC URINE PREG, ED  ?TROPONIN I (HIGH SENSITIVITY)  ? ? ? ?EKG ? ?ED ECG REPORT ?IDionne Bucy, the attending physician, personally viewed and interpreted this ECG. ? ?Date: 09/10/2021 ?EKG Time: 1717 ?Rate: 87 ?Rhythm: normal sinus rhythm ?QRS Axis: normal ?Intervals: normal ?ST/T Wave abnormalities: normal ?Narrative Interpretation: no evidence of acute ischemia ? ? ? ?RADIOLOGY ? ?CT abdomen/pelvis: Dependently viewed and interpreted the images; there are no dilated bowel loops, air-fluid levels, or evidence of inflammation.  Radiology read confirms no acute findings. ? ?PROCEDURES: ? ?Critical Care performed: No ? ?Procedures ? ? ?  MEDICATIONS ORDERED IN ED: ?Medications  ?sucralfate (CARAFATE) tablet 1 g (has no administration in time range)  ?dicyclomine (BENTYL) capsule 10 mg (has no administration in time range)  ?iohexol (OMNIPAQUE) 300 MG/ML solution 100 mL (100 mLs Intravenous Contrast Given 09/10/21 1823)  ? ? ? ?IMPRESSION / MDM / ASSESSMENT AND PLAN / ED COURSE  ?I reviewed the triage vital signs and the nursing notes. ? ?46 year old female with PMH as noted above presents with subacute epigastric and central abdominal pain worse with eating and associated with GERD symptoms as well.  She reports some swelling to the center of the abdomen which feels similar to her prior hernia. ? ?I reviewed the past medical records.  The patient had a screening colonoscopy on 01/21/2021 but otherwise has not had any recent GI work-up or imaging ? ?On exam, the patient is  well-appearing. ? ?Differential diagnosis includes, but is not limited to, gastritis, GERD, PUD, pancreatitis, other hepatobiliary cause, colitis, diverticulitis.  We will obtain lab work-up, CT abdomen/pelvis, and reassess. ? ?----------------------------------------- ?8:33 PM on 09/10/2021 ?----------------------------------------- ? ?Lab work-up is unremarkable.  CMP and CBC are normal.  Lipase is normal.  Urinalysis shows no signs of UTI.  CT shows no acute findings. ? ?I counseled the patient on the results of the work-up.  Overall I still suspect likely gastritis versus PUD as the main etiology of her symptoms.  I have provided a referral to gastroenterology.  The patient is stable for discharge home at this time.  I instructed her to continue pantoprazole and have added on Carafate and Bentyl as well as Reglan for nausea.  Return precautions given, and she expresses understanding. ? ? ?FINAL CLINICAL IMPRESSION(S) / ED DIAGNOSES  ? ?Final diagnoses:  ?Epigastric pain  ? ? ? ?Rx / DC Orders  ? ?ED Discharge Orders   ? ?      Ordered  ?  dicyclomine (BENTYL) 10 MG capsule  Every 8 hours PRN       ? 09/10/21 2027  ?  metoCLOPramide (REGLAN) 10 MG tablet  Every 8 hours PRN       ? 09/10/21 2027  ?  sucralfate (CARAFATE) 1 g tablet  4 times daily PRN       ? 09/10/21 2027  ? ?  ?  ? ?  ? ? ? ?Note:  This document was prepared using Dragon voice recognition software and may include unintentional dictation errors.  ?  Dionne Bucy, MD ?09/10/21 2033 ? ?  ?Dionne Bucy, MD ?09/10/21 2034 ? ?

## 2021-09-10 NOTE — Discharge Instructions (Addendum)
Your CT scan does not show any acute findings.  We suspect that your pain is likely due to gastritis (inflammation in the stomach lining) or less likely but possible ulcer. ? ?Follow-up with the surgeon as scheduled.  We have also given you referral to see a gastroenterologist. ? ?Continue to take the pantoprazole that you are on.  In addition we have prescribed Carafate for the upper abdominal pain associated with meals, as well as Bentyl to be used as needed for more crampy pain or spasms.  You may also take the Reglan as needed for nausea. ? ?Return to the ER for new, worsening, or persistent severe abdominal pain, vomiting, fever, weakness, or any other new or worsening symptoms that concern you. ?

## 2021-09-10 NOTE — ED Notes (Signed)
Report received from Elana, RN ?

## 2021-09-10 NOTE — ED Provider Triage Note (Signed)
?  Emergency Medicine Provider Triage Evaluation Note ? ?Meghan Thompson , a 46 y.o.female,  was evaluated in triage.  Pt complains of mid/right upper quadrant pain.  She states that this been going on for the past couple months, though has worsened over this past few weeks.  Additionally endorses severe indigestion, bloating, and diarrhea. ? ?Review of Systems  ?Positive: Abdominal pain, diarrhea, indigestion/bloating. ?Negative: Denies fever, chest pain, vomiting ? ?Physical Exam  ? ?Vitals:  ? 09/10/21 1709  ?BP: 138/83  ?Pulse: 90  ?Resp: 16  ?Temp: 99.1 ?F (37.3 ?C)  ?SpO2: 92%  ? ?Gen:   Awake, no distress   ?Resp:  Normal effort  ?MSK:   Moves extremities without difficulty  ?Other:  Abdomen significantly distended.  Tenderness when palpating the right upper quadrant and epigastric region. ? ?Medical Decision Making  ?Given the patient's initial medical screening exam, the following diagnostic evaluation has been ordered. The patient will be placed in the appropriate treatment space, once one is available, to complete the evaluation and treatment. I have discussed the plan of care with the patient and I have advised the patient that an ED physician or mid-level practitioner will reevaluate their condition after the test results have been received, as the results may give them additional insight into the type of treatment they may need.  ? ? ?Diagnostics: Labs, EKG, abdominal CT. ? ?Treatments: none immediately ?  ?Varney Daily, PA ?09/10/21 1734 ? ?

## 2021-09-10 NOTE — ED Notes (Signed)
Pt refused discharge vitals at this time.

## 2021-09-10 NOTE — ED Notes (Signed)
ED Provider at bedside. 

## 2021-10-14 ENCOUNTER — Ambulatory Visit
Admission: RE | Admit: 2021-10-14 | Discharge: 2021-10-14 | Disposition: A | Discharge status: Home / Self Care | Payer: BC Managed Care – PPO | Source: Ambulatory Visit | Attending: Family Medicine | Admitting: Family Medicine

## 2021-10-14 DIAGNOSIS — Z1231 Encounter for screening mammogram for malignant neoplasm of breast: Secondary | ICD-10-CM | POA: Diagnosis present

## 2022-01-11 ENCOUNTER — Other Ambulatory Visit (HOSPITAL_COMMUNITY): Payer: Self-pay | Admitting: Family Medicine

## 2022-01-11 ENCOUNTER — Other Ambulatory Visit: Payer: Self-pay | Admitting: Family Medicine

## 2022-01-11 DIAGNOSIS — G500 Trigeminal neuralgia: Secondary | ICD-10-CM

## 2022-01-11 DIAGNOSIS — R202 Paresthesia of skin: Secondary | ICD-10-CM

## 2022-01-20 ENCOUNTER — Ambulatory Visit
Admission: RE | Admit: 2022-01-20 | Discharge: 2022-01-20 | Disposition: A | Discharge status: Home / Self Care | Payer: BC Managed Care – PPO | Source: Ambulatory Visit | Attending: Family Medicine | Admitting: Family Medicine

## 2022-01-20 DIAGNOSIS — R202 Paresthesia of skin: Secondary | ICD-10-CM | POA: Diagnosis present

## 2022-01-20 DIAGNOSIS — G500 Trigeminal neuralgia: Secondary | ICD-10-CM | POA: Diagnosis present

## 2022-01-20 MED ORDER — GADOBUTROL 1 MMOL/ML IV SOLN
9.0000 mL | Freq: Once | INTRAVENOUS | Status: AC | PRN
  Administered 2022-01-20: 9 mL via INTRAVENOUS

## 2022-02-16 ENCOUNTER — Encounter: Payer: Self-pay | Admitting: Gastroenterology

## 2022-02-16 NOTE — H&P (Signed)
Pre-Procedure H&P   Patient ID: Meghan Thompson is a 46 y.o. female.  Gastroenterology Provider: Annamaria Helling, DO  Referring Provider: Octavia Bruckner, PA PCP: Donnie Coffin, MD  Date: 02/17/2022  HPI Meghan Thompson is a 46 y.o. female who presents today for Esophagogastroduodenoscopy for Abdominal pain, dyspepsia.  Patient with ongoing epigastric postprandial discomfort.  She is status postcholecystectomy.  CT performed in [place] demonstrated postcholecystectomy state along with uterine fibroids. She notes abdominal bloating exacerbated by food.  BMs have been regular and weight and appetite have been stable.  Somewhat improved by Protonix use. Not  using prednisone at this time. Most recent lab work hemoglobin 12.5 MCV 82 platelets 272,000 creatinine 0.6 AST 20 ALT 22 alk phos 84 total bili 0.3  Brother with a history of gastric cancer   Past Medical History:  Diagnosis Date   Arthritis    Asthma    BRCA negative 2017   MyRisk neg   Family history of adverse reaction to anesthesia    MOM HAS A HARD TIME WAKING UP   Family history of breast cancer 2017   GERD (gastroesophageal reflux disease)    Hypertension    Increased risk of breast cancer 2017   IBIS=22.3%    Past Surgical History:  Procedure Laterality Date   BILATERAL CARPAL TUNNEL RELEASE     CESAREAN SECTION     CHOLECYSTECTOMY     COLONOSCOPY N/A 01/21/2021   Procedure: COLONOSCOPY;  Surgeon: Annamaria Helling, DO;  Location: [facility] ENDOSCOPY;  Service: Gastroenterology;  Laterality: N/A;   DILATION AND CURETTAGE OF UTERUS     KNEE ARTHROSCOPY WITH MEDIAL PATELLAR FEMORAL LIGAMENT RECONSTRUCTION Right 02/03/2015   Procedure: KNEE ARTHROSCOPY WITH PARTIAL LASTERAL MENISECTOMY, MICROSCOPIC LATERAL RELEASE, MPFL RECONSTRUCTION;  Surgeon: Hessie Knows, MD;  Location: [facility] ORS;  Service: Orthopedics;  Laterality: Right;   WISDOM TOOTH EXTRACTION      Family History Brother with a history of gastric  cancer No h/o GI disease or malignancy  Review of Systems  Constitutional:  Negative for activity change, appetite change, chills, diaphoresis, fatigue, fever and unexpected weight change.  HENT:  Negative for trouble swallowing and voice change.   Respiratory:  Negative for shortness of breath and wheezing.   Cardiovascular:  Negative for chest pain, palpitations and leg swelling.  Gastrointestinal:  Positive for abdominal pain. Negative for abdominal distention, anal bleeding, blood in stool, constipation, diarrhea, nausea, rectal pain and vomiting.  Musculoskeletal:  Negative for arthralgias and myalgias.  Skin:  Negative for color change and pallor.  Neurological:  Negative for dizziness, syncope and weakness.  Psychiatric/Behavioral:  Negative for confusion.   All other systems reviewed and are negative.    Medications No current facility-administered medications on file prior to encounter.   Current Outpatient Medications on File Prior to Encounter  Medication Sig Dispense Refill   Albuterol (VENTOLIN IN) Inhale 2 puffs into the lungs as needed.     Ascorbic Acid (VITAMIN C) 100 MG tablet Take 100 mg by mouth daily.     Budeson-Glycopyrrol-Formoterol (BREZTRI AEROSPHERE) 160-9-4.8 MCG/ACT AERO Inhale 2 puffs into the lungs in the morning and at bedtime.     budesonide (PULMICORT) 0.5 MG/2ML nebulizer solution Take 0.5 mg by nebulization daily.     dicyclomine (BENTYL) 10 MG capsule Take 1 capsule (10 mg total) by mouth every 8 (eight) hours as needed (abdominal cramping). 12 capsule 0   Etanercept (ENBREL MINI) 50 MG/ML SOCT Inject 1 mL into  the skin once a week.     fluticasone (FLONASE) 50 MCG/ACT nasal spray Place 1 spray into both nostrils as needed for allergies or rhinitis.     hydroxychloroquine (PLAQUENIL) 200 MG tablet Take 200 mg by mouth 2 (two) times daily.     ibuprofen (ADVIL,MOTRIN) 200 MG tablet Take 200 mg by mouth every 6 (six) hours as needed.     ipratropium  (ATROVENT) 0.03 % nasal spray Place 2 sprays into both nostrils 3 (three) times daily as needed for rhinitis.     lisinopril-hydrochlorothiazide (PRINZIDE,ZESTORETIC) 20-12.5 MG per tablet Take 1 tablet by mouth at bedtime.     methocarbamol (ROBAXIN) 500 MG tablet Take 500 mg by mouth as needed for muscle spasms.     metoCLOPramide (REGLAN) 10 MG tablet Take 1 tablet (10 mg total) by mouth every 8 (eight) hours as needed for up to 15 days for nausea or vomiting. 15 tablet 0   montelukast (SINGULAIR) 10 MG tablet Take 10 mg by mouth at bedtime.     omeprazole (PRILOSEC) 10 MG capsule Take 10 mg by mouth as needed.     oxyCODONE-acetaminophen (ROXICET) 5-325 MG per tablet Take 1-2 tablets by mouth every 6 (six) hours as needed for severe pain. (Patient not taking: Reported on 07/25/2017) 60 tablet 0   sucralfate (CARAFATE) 1 g tablet Take 1 tablet (1 g total) by mouth 4 (four) times daily as needed. 120 tablet 11    Pertinent medications related to GI and procedure were reviewed by me with the patient prior to the procedure   Current Facility-Administered Medications:    0.9 %  sodium chloride infusion, , Intravenous, Continuous, Annamaria Helling, DO      Allergies  Allergen Reactions   Ciprofloxacin Rash   Citalopram Rash   Allergies were reviewed by me prior to the procedure  Objective   Body mass index is 40.23 kg/m. Vitals:   02/17/22 0808  BP: (!) 147/73  Pulse: 72  Resp: 18  Temp: (!) 97.3 F (36.3 C)  TempSrc: Temporal  SpO2: 98%  Weight: 93.4 kg  Height: 5' (1.524 m)     Physical Exam Vitals and nursing note reviewed.  Constitutional:      General: She is not in acute distress.    Appearance: Normal appearance. She is obese. She is not ill-appearing, toxic-appearing or diaphoretic.  HENT:     Head: Normocephalic and atraumatic.     Nose: Nose normal.     Mouth/Throat:     Mouth: Mucous membranes are moist.     Pharynx: Oropharynx is clear.  Eyes:      General: No scleral icterus.    Extraocular Movements: Extraocular movements intact.  Cardiovascular:     Rate and Rhythm: Normal rate and regular rhythm.     Heart sounds: Normal heart sounds. No murmur heard.    No friction rub. No gallop.  Pulmonary:     Effort: Pulmonary effort is normal. No respiratory distress.     Breath sounds: Normal breath sounds. No wheezing, rhonchi or rales.  Abdominal:     General: Bowel sounds are normal. There is no distension.     Palpations: Abdomen is soft.     Tenderness: There is no abdominal tenderness. There is no guarding or rebound.  Musculoskeletal:     Cervical back: Neck supple.     Right lower leg: No edema.     Left lower leg: No edema.  Skin:    General: Skin  is warm and dry.     Coloration: Skin is not jaundiced or pale.  Neurological:     General: No focal deficit present.     Mental Status: She is alert and oriented to person, place, and time. Mental status is at baseline.  Psychiatric:        Mood and Affect: Mood normal.        Behavior: Behavior normal.        Thought Content: Thought content normal.        Judgment: Judgment normal.      Assessment:  Meghan Thompson is a 46 y.o. female  who presents today for Esophagogastroduodenoscopy for abdominal pain, dyspepsia.  Plan:  Esophagogastroduodenoscopy with possible intervention today  Esophagogastroduodenoscopy with possible biopsy, control of bleeding, polypectomy, and interventions as necessary has been discussed with the patient/patient representative. Informed consent was obtained from the patient/patient representative after explaining the indication, nature, and risks of the procedure including but not limited to death, bleeding, perforation, missed neoplasm/lesions, cardiorespiratory compromise, and reaction to medications. Opportunity for questions was given and appropriate answers were provided. Patient/patient representative has verbalized understanding is  amenable to undergoing the procedure.   Annamaria Helling, DO  [facility]  Portions of the record may have been created with voice recognition software. Occasional wrong-word or 'sound-a-like' substitutions may have occurred due to the inherent limitations of voice recognition software.  Read the chart carefully and recognize, using context, where substitutions may have occurred.

## 2022-02-17 ENCOUNTER — Encounter: Payer: Self-pay | Admitting: Gastroenterology

## 2022-02-17 ENCOUNTER — Ambulatory Visit: Payer: BC Managed Care – PPO | Admitting: Certified Registered Nurse Anesthetist

## 2022-02-17 ENCOUNTER — Encounter
Admission: RE | Disposition: A | Discharge status: Home / Self Care | Payer: Self-pay | Source: Home / Self Care | Attending: Gastroenterology

## 2022-02-17 ENCOUNTER — Ambulatory Visit
Admission: RE | Admit: 2022-02-17 | Discharge: 2022-02-17 | Disposition: A | Discharge status: Home / Self Care | Payer: BC Managed Care – PPO | Attending: Gastroenterology | Admitting: Gastroenterology

## 2022-02-17 DIAGNOSIS — Z79899 Other long term (current) drug therapy: Secondary | ICD-10-CM | POA: Insufficient documentation

## 2022-02-17 DIAGNOSIS — K2950 Unspecified chronic gastritis without bleeding: Secondary | ICD-10-CM | POA: Diagnosis not present

## 2022-02-17 DIAGNOSIS — Z6841 Body Mass Index (BMI) 40.0 and over, adult: Secondary | ICD-10-CM | POA: Insufficient documentation

## 2022-02-17 DIAGNOSIS — J45909 Unspecified asthma, uncomplicated: Secondary | ICD-10-CM | POA: Diagnosis not present

## 2022-02-17 DIAGNOSIS — M1990 Unspecified osteoarthritis, unspecified site: Secondary | ICD-10-CM | POA: Diagnosis not present

## 2022-02-17 DIAGNOSIS — K219 Gastro-esophageal reflux disease without esophagitis: Secondary | ICD-10-CM | POA: Diagnosis not present

## 2022-02-17 DIAGNOSIS — K317 Polyp of stomach and duodenum: Secondary | ICD-10-CM | POA: Diagnosis not present

## 2022-02-17 DIAGNOSIS — R1013 Epigastric pain: Secondary | ICD-10-CM | POA: Insufficient documentation

## 2022-02-17 DIAGNOSIS — R140 Abdominal distension (gaseous): Secondary | ICD-10-CM | POA: Diagnosis not present

## 2022-02-17 DIAGNOSIS — I10 Essential (primary) hypertension: Secondary | ICD-10-CM | POA: Insufficient documentation

## 2022-02-17 HISTORY — PX: ESOPHAGOGASTRODUODENOSCOPY (EGD) WITH PROPOFOL: SHX5813

## 2022-02-17 LAB — POCT PREGNANCY, URINE: Preg Test, Ur: NEGATIVE

## 2022-02-17 SURGERY — ESOPHAGOGASTRODUODENOSCOPY (EGD) WITH PROPOFOL
Anesthesia: General

## 2022-02-17 MED ORDER — PROPOFOL 500 MG/50ML IV EMUL
INTRAVENOUS | Status: DC | PRN
  Administered 2022-02-17: 150 ug/kg/min via INTRAVENOUS

## 2022-02-17 MED ORDER — PROPOFOL 10 MG/ML IV BOLUS
INTRAVENOUS | Status: AC
  Filled 2022-02-17: qty 20

## 2022-02-17 MED ORDER — PROPOFOL 10 MG/ML IV BOLUS
INTRAVENOUS | Status: DC | PRN
  Administered 2022-02-17: 20 mg via INTRAVENOUS
  Administered 2022-02-17: 80 mg via INTRAVENOUS

## 2022-02-17 MED ORDER — LIDOCAINE HCL (CARDIAC) PF 100 MG/5ML IV SOSY
PREFILLED_SYRINGE | INTRAVENOUS | Status: DC | PRN
  Administered 2022-02-17: 100 mg via INTRAVENOUS

## 2022-02-17 MED ORDER — SODIUM CHLORIDE 0.9 % IV SOLN: INTRAVENOUS | Status: DC

## 2022-02-17 MED ORDER — GLYCOPYRROLATE 0.2 MG/ML IJ SOLN
INTRAMUSCULAR | Status: AC
  Filled 2022-02-17: qty 2

## 2022-02-17 MED ORDER — PROPOFOL 1000 MG/100ML IV EMUL
INTRAVENOUS | Status: AC
Start: 2022-02-17 — End: ?
  Filled 2022-02-17: qty 100

## 2022-02-17 MED ORDER — GLYCOPYRROLATE 0.2 MG/ML IJ SOLN
INTRAMUSCULAR | Status: DC | PRN
  Administered 2022-02-17: .2 mg via INTRAVENOUS

## 2022-02-17 NOTE — Interval H&P Note (Signed)
History and Physical Interval Note: Preprocedure H&P from 02/17/22  was reviewed and there was no interval change after seeing and examining the patient.  Written consent was obtained from the patient after discussion of risks, benefits, and alternatives. Patient has consented to proceed with Esophagogastroduodenoscopy with possible intervention   02/17/2022 8:20 AM  Meghan Thompson  has presented today for surgery, with the diagnosis of EPIGASTRIC PAIN DYSPEPSIA.  The various methods of treatment have been discussed with the patient and family. After consideration of risks, benefits and other options for treatment, the patient has consented to  Procedure(s): ESOPHAGOGASTRODUODENOSCOPY (EGD) WITH PROPOFOL (N/A) as a surgical intervention.  The patient's history has been reviewed, patient examined, no change in status, stable for surgery.  I have reviewed the patient's chart and labs.  Questions were answered to the patient's satisfaction.     Annamaria Helling

## 2022-02-17 NOTE — Anesthesia Procedure Notes (Signed)
Date/Time: 02/17/2022 8:36 AM  Performed by: Lily Peer, Nadyne Gariepy, CRNAPre-anesthesia Checklist: Patient identified, Emergency Drugs available, Suction available, Patient being monitored and Timeout performed Patient Re-evaluated:Patient Re-evaluated prior to induction Oxygen Delivery Method: Simple face mask Induction Type: IV induction

## 2022-02-17 NOTE — Transfer of Care (Signed)
Immediate Anesthesia Transfer of Care Note  Patient: Meghan Thompson  Procedure(s) Performed: ESOPHAGOGASTRODUODENOSCOPY (EGD) WITH PROPOFOL  Patient Location: Endoscopy Unit  Anesthesia Type:General  Level of Consciousness: drowsy  Airway & Oxygen Therapy: Patient Spontanous Breathing  Post-op Assessment: Report given to RN and Post -op Vital signs reviewed and stable  Post vital signs: Reviewed and stable  Last Vitals:  Vitals Value Taken Time  BP 142/76 02/17/22 0846  Temp 36.4 C 02/17/22 0845  Pulse 86 02/17/22 0846  Resp 17 02/17/22 0846  SpO2 99 % 02/17/22 0846  Vitals shown include unvalidated device data.  Last Pain:  Vitals:   02/17/22 0845  TempSrc: Tympanic  PainSc: 0-No pain         Complications: No notable events documented.

## 2022-02-17 NOTE — Anesthesia Preprocedure Evaluation (Signed)
Anesthesia Evaluation  Patient identified by MRN, date of birth, ID band Patient awake    Reviewed: Allergy & Precautions, NPO status , Patient's Chart, lab work & pertinent test results  Airway Mallampati: III  TM Distance: >3 FB Neck ROM: Full    Dental  (+) Teeth Intact   Pulmonary neg pulmonary ROS, asthma ,    Pulmonary exam normal breath sounds clear to auscultation       Cardiovascular Exercise Tolerance: Good hypertension, Pt. on medications negative cardio ROS Normal cardiovascular exam Rhythm:Regular     Neuro/Psych negative neurological ROS  negative psych ROS   GI/Hepatic negative GI ROS, Neg liver ROS, GERD  Medicated,  Endo/Other  negative endocrine ROSMorbid obesity  Renal/GU negative Renal ROS  negative genitourinary   Musculoskeletal  (+) Arthritis ,   Abdominal (+) + obese,   Peds negative pediatric ROS (+)  Hematology negative hematology ROS (+)   Anesthesia Other Findings Past Medical History: No date: Arthritis No date: Asthma 2017: BRCA negative     Comment:  MyRisk neg No date: Family history of adverse reaction to anesthesia     Comment:  MOM HAS A HARD TIME WAKING UP 2017: Family history of breast cancer No date: GERD (gastroesophageal reflux disease) No date: Hypertension 2017: Increased risk of breast cancer     Comment:  IBIS=22.3%  Past Surgical History: No date: BILATERAL CARPAL TUNNEL RELEASE No date: CESAREAN SECTION No date: CHOLECYSTECTOMY 01/21/2021: COLONOSCOPY; N/A     Comment:  Procedure: COLONOSCOPY;  Surgeon: Annamaria Helling,              DO;  Location: [facility] ENDOSCOPY;  Service:               Gastroenterology;  Laterality: N/A; No date: DILATION AND CURETTAGE OF UTERUS 02/03/2015: KNEE ARTHROSCOPY WITH MEDIAL PATELLAR FEMORAL LIGAMENT  RECONSTRUCTION; Right     Comment:  Procedure: KNEE ARTHROSCOPY WITH PARTIAL LASTERAL               MENISECTOMY,  MICROSCOPIC LATERAL RELEASE, MPFL               RECONSTRUCTION;  Surgeon: Hessie Knows, MD;  Location:               [facility] ORS;  Service: Orthopedics;  Laterality: Right; No date: WISDOM TOOTH EXTRACTION  BMI    Body Mass Index: 40.23 kg/m      Reproductive/Obstetrics negative OB ROS                             Anesthesia Physical Anesthesia Plan  ASA: 3  Anesthesia Plan: General   Post-op Pain Management:    Induction: Intravenous  PONV Risk Score and Plan: Propofol infusion and TIVA  Airway Management Planned: Natural Airway  Additional Equipment:   Intra-op Plan:   Post-operative Plan:   Informed Consent: I have reviewed the patients History and Physical, chart, labs and discussed the procedure including the risks, benefits and alternatives for the proposed anesthesia with the patient or authorized representative who has indicated his/her understanding and acceptance.     Dental Advisory Given  Plan Discussed with: CRNA and Surgeon  Anesthesia Plan Comments:         Anesthesia Quick Evaluation

## 2022-02-17 NOTE — Op Note (Addendum)
[facility] Gastroenterology Patient Name: Meghan Thompson Procedure Date: 02/17/2022 8:31 AM MRN: [id number] Account #: [id number] Date of Birth: Sep 09, 1975 Admit Type: Outpatient Age: 46 Room: [facility] ENDO ROOM 1 Gender: Female Note Status: Finalized Instrument Name: Upper Endoscope [id number] Procedure:             Upper GI endoscopy Indications:           Epigastric abdominal pain, Esophageal reflux Providers:             Annamaria Helling DO, DO Referring MD:          Ngwe A. Aycock MD (Referring MD) Medicines:             Monitored Anesthesia Care Complications:         No immediate complications. Estimated blood loss:                         Minimal. Procedure:             Pre-Anesthesia Assessment:                        - Prior to the procedure, a History and Physical was                         performed, and patient medications and allergies were                         reviewed. The patient is competent. The risks and                         benefits of the procedure and the sedation options and                         risks were discussed with the patient. All questions                         were answered and informed consent was obtained.                         Patient identification and proposed procedure were                         verified by the physician, the nurse, the anesthetist                         and the technician in the endoscopy suite. Mental                         Status Examination: alert and oriented. Airway                         Examination: normal oropharyngeal airway and neck                         mobility. Respiratory Examination: clear to                         auscultation. CV Examination: RRR, no murmurs, no S3  or S4. Prophylactic Antibiotics: The patient does not                         require prophylactic antibiotics. Prior                         Anticoagulants: The patient has taken no  previous                         anticoagulant or antiplatelet agents. ASA Grade                         Assessment: III - A patient with severe systemic                         disease. After reviewing the risks and benefits, the                         patient was deemed in satisfactory condition to                         undergo the procedure. The anesthesia plan was to use                         monitored anesthesia care (MAC). Immediately prior to                         administration of medications, the patient was                         re-assessed for adequacy to receive sedatives. The                         heart rate, respiratory rate, oxygen saturations,                         blood pressure, adequacy of pulmonary ventilation, and                         response to care were monitored throughout the                         procedure. The physical status of the patient was                         re-assessed after the procedure.                        After obtaining informed consent, the endoscope was                         passed under direct vision. Throughout the procedure,                         the patient's blood pressure, pulse, and oxygen                         saturations were monitored continuously. The Endoscope  was introduced through the mouth, and advanced to the                         second part of duodenum. The upper GI endoscopy was                         accomplished without difficulty. The patient tolerated                         the procedure well. Findings:      Localized mild mucosal changes characterized by flattening, friability       (with contact bleeding) and granularity were found in the second portion       of the duodenum. Biopsies were taken with a cold forceps for histology.       Estimated blood loss was minimal.      The exam of the duodenum was otherwise normal.      The ampulla, duodenal bulb and first  portion of the duodenum were       normal. Estimated blood loss: none.      Localized moderate inflammation characterized by erosions, erythema and       granularity was found in the entire examined stomach. Biopsies were       taken with a cold forceps for Helicobacter pylori testing. Estimated       blood loss was minimal.      A few 5 to 7 mm sessile polyps with no bleeding and no stigmata of       recent bleeding were found in the gastric antrum. Biopsies were taken       with a cold forceps for histology. Estimated blood loss was minimal.      The exam of the stomach was otherwise normal.      The Z-line was regular. Estimated blood loss: none.      Esophagogastric landmarks were identified: the gastroesophageal junction       was found at 36 cm from the incisors.      The exam of the esophagus was otherwise normal. Impression:            - Mucosal changes in the duodenum. Biopsied.                        - Normal ampulla, duodenal bulb and first portion of                         the duodenum.                        - Gastritis. Biopsied.                        - A few gastric polyps. Biopsied.                        - Z-line regular.                        - Esophagogastric landmarks identified. Recommendation:        - Patient has a contact number available for                         emergencies.  The signs and symptoms of potential                         delayed complications were discussed with the patient.                         Return to normal activities tomorrow. Written                         discharge instructions were provided to the patient.                        - Discharge patient to home.                        - Resume previous diet.                        - Continue present medications.                        - Continue acid suppressive therapy                         (pantoprazole/protonix)                        - No aspirin, ibuprofen, naproxen, or other                          non-steroidal anti-inflammatory drugs.                        - Await pathology results.                        - Return to GI clinic as previously scheduled.                        - The findings and recommendations were discussed with                         the patient. Procedure Code(s):     --- Professional ---                        [phone], Esophagogastroduodenoscopy, flexible,                         transoral; with biopsy, single or multiple Diagnosis Code(s):     --- Professional ---                        K31.89, Other diseases of stomach and duodenum                        K29.70, Gastritis, unspecified, without bleeding                        K31.7, Polyp of stomach and duodenum                        R10.13, Epigastric pain  K21.9, Gastro-esophageal reflux disease without                         esophagitis CPT copyright 2019 [employer]. All rights reserved. The codes documented in this report are preliminary and upon coder review may  be revised to meet current compliance requirements. Attending Participation:      I personally performed the entire procedure. Volney American, DO Annamaria Helling DO, DO 02/17/2022 8:48:33 AM This report has been signed electronically. Number of Addenda: 0 Note Initiated On: 02/17/2022 8:31 AM Estimated Blood Loss:  Estimated blood loss was minimal.      [facility]

## 2022-02-17 NOTE — Anesthesia Postprocedure Evaluation (Signed)
Anesthesia Post Note  Patient: Annabelle D Beaupre  Procedure(s) Performed: ESOPHAGOGASTRODUODENOSCOPY (EGD) WITH PROPOFOL  Patient location during evaluation: PACU Anesthesia Type: General Level of consciousness: awake and oriented Pain management: pain level controlled Vital Signs Assessment: post-procedure vital signs reviewed and stable Respiratory status: spontaneous breathing and respiratory function stable Cardiovascular status: stable Anesthetic complications: no   No notable events documented.   Last Vitals:  Vitals:   02/17/22 0845 02/17/22 0901  BP: (!) 142/76 (!) 140/82  Pulse: 83 69  Resp: 19 18  Temp: (!) 36.4 C   SpO2: 99% 100%    Last Pain:  Vitals:   02/17/22 0901  TempSrc:   PainSc: 0-No pain                 VAN STAVEREN,Lucile Hillmann

## 2022-02-18 ENCOUNTER — Encounter: Payer: Self-pay | Admitting: Gastroenterology

## 2022-02-18 LAB — SURGICAL PATHOLOGY

## 2022-11-14 ENCOUNTER — Encounter: Payer: Self-pay | Admitting: Family Medicine

## 2022-11-14 DIAGNOSIS — Z1231 Encounter for screening mammogram for malignant neoplasm of breast: Secondary | ICD-10-CM

## 2022-11-23 ENCOUNTER — Other Ambulatory Visit: Payer: Self-pay | Admitting: Family Medicine

## 2022-11-23 DIAGNOSIS — Z1231 Encounter for screening mammogram for malignant neoplasm of breast: Secondary | ICD-10-CM

## 2022-11-25 ENCOUNTER — Ambulatory Visit
Admission: RE | Admit: 2022-11-25 | Discharge: 2022-11-25 | Disposition: A | Discharge status: Home / Self Care | Payer: No Typology Code available for payment source | Source: Ambulatory Visit | Attending: Family Medicine | Admitting: Family Medicine

## 2022-11-25 DIAGNOSIS — Z1231 Encounter for screening mammogram for malignant neoplasm of breast: Secondary | ICD-10-CM | POA: Insufficient documentation

## 2023-10-27 ENCOUNTER — Other Ambulatory Visit: Payer: Self-pay | Admitting: Student

## 2023-10-27 DIAGNOSIS — G8929 Other chronic pain: Secondary | ICD-10-CM

## 2023-10-27 DIAGNOSIS — M069 Rheumatoid arthritis, unspecified: Secondary | ICD-10-CM

## 2023-11-01 ENCOUNTER — Encounter: Payer: Self-pay | Admitting: Student

## 2023-11-15 ENCOUNTER — Inpatient Hospital Stay
Admission: RE | Admit: 2023-11-15 | Discharge: 2023-11-15 | Disposition: A | Discharge status: Home / Self Care | Source: Ambulatory Visit | Attending: Student

## 2023-11-15 DIAGNOSIS — M069 Rheumatoid arthritis, unspecified: Secondary | ICD-10-CM

## 2023-11-15 DIAGNOSIS — G8929 Other chronic pain: Secondary | ICD-10-CM

## 2023-11-15 MED ORDER — GADOPICLENOL 0.5 MMOL/ML IV SOLN
7.5000 mL | Freq: Once | INTRAVENOUS | Status: AC | PRN
  Administered 2023-11-15: 7.5 mL via INTRAVENOUS

## 2024-01-11 ENCOUNTER — Other Ambulatory Visit: Payer: Self-pay | Admitting: Family Medicine

## 2024-01-11 DIAGNOSIS — Z1231 Encounter for screening mammogram for malignant neoplasm of breast: Secondary | ICD-10-CM

## 2024-01-30 ENCOUNTER — Ambulatory Visit
Admission: RE | Admit: 2024-01-30 | Discharge: 2024-01-30 | Disposition: A | Discharge status: Home / Self Care | Source: Ambulatory Visit | Attending: Family Medicine | Admitting: Family Medicine

## 2024-01-30 DIAGNOSIS — Z1231 Encounter for screening mammogram for malignant neoplasm of breast: Secondary | ICD-10-CM | POA: Insufficient documentation

## 2024-02-05 ENCOUNTER — Other Ambulatory Visit: Payer: Self-pay | Admitting: Family Medicine

## 2024-02-05 DIAGNOSIS — R928 Other abnormal and inconclusive findings on diagnostic imaging of breast: Secondary | ICD-10-CM

## 2024-02-14 ENCOUNTER — Ambulatory Visit
Admission: RE | Admit: 2024-02-14 | Discharge: 2024-02-14 | Disposition: A | Discharge status: Home / Self Care | Source: Ambulatory Visit | Attending: Family Medicine | Admitting: Family Medicine

## 2024-02-14 DIAGNOSIS — R928 Other abnormal and inconclusive findings on diagnostic imaging of breast: Secondary | ICD-10-CM | POA: Insufficient documentation
